# Patient Record
Sex: Female | Born: 1955 | Race: White | Hispanic: No | Smoking: Former smoker
Health system: Southern US, Community
[De-identification: ages and names within clinical notes are randomized; demographics above are authoritative.]

## PROBLEM LIST (undated history)

## (undated) DIAGNOSIS — N952 Postmenopausal atrophic vaginitis: Secondary | ICD-10-CM

## (undated) DIAGNOSIS — E039 Hypothyroidism, unspecified: Secondary | ICD-10-CM

## (undated) DIAGNOSIS — T7840XA Allergy, unspecified, initial encounter: Secondary | ICD-10-CM

## (undated) DIAGNOSIS — D259 Leiomyoma of uterus, unspecified: Secondary | ICD-10-CM

## (undated) DIAGNOSIS — Q438 Other specified congenital malformations of intestine: Secondary | ICD-10-CM

## (undated) DIAGNOSIS — D126 Benign neoplasm of colon, unspecified: Secondary | ICD-10-CM

## (undated) DIAGNOSIS — K6389 Other specified diseases of intestine: Secondary | ICD-10-CM

## (undated) DIAGNOSIS — N200 Calculus of kidney: Secondary | ICD-10-CM

## (undated) DIAGNOSIS — M8580 Other specified disorders of bone density and structure, unspecified site: Secondary | ICD-10-CM

## (undated) DIAGNOSIS — K219 Gastro-esophageal reflux disease without esophagitis: Secondary | ICD-10-CM

## (undated) DIAGNOSIS — K449 Diaphragmatic hernia without obstruction or gangrene: Secondary | ICD-10-CM

## (undated) DIAGNOSIS — K589 Irritable bowel syndrome without diarrhea: Secondary | ICD-10-CM

## (undated) HISTORY — DX: Hypothyroidism, unspecified: E03.9

## (undated) HISTORY — DX: Allergy, unspecified, initial encounter: T78.40XA

## (undated) HISTORY — DX: Benign neoplasm of colon, unspecified: D12.6

## (undated) HISTORY — DX: Irritable bowel syndrome, unspecified: K58.9

## (undated) HISTORY — DX: Leiomyoma of uterus, unspecified: D25.9

## (undated) HISTORY — DX: Other specified congenital malformations of intestine: Q43.8

## (undated) HISTORY — PX: DILATION AND CURETTAGE OF UTERUS: SHX78

## (undated) HISTORY — PX: COLONOSCOPY: SHX174

## (undated) HISTORY — PX: BUNIONECTOMY: SHX129

## (undated) HISTORY — DX: Diaphragmatic hernia without obstruction or gangrene: K44.9

## (undated) HISTORY — DX: Gastro-esophageal reflux disease without esophagitis: K21.9

## (undated) HISTORY — DX: Calculus of kidney: N20.0

## (undated) HISTORY — PX: UPPER GASTROINTESTINAL ENDOSCOPY: SHX188

## (undated) HISTORY — DX: Postmenopausal atrophic vaginitis: N95.2

## (undated) HISTORY — PX: POLYPECTOMY: SHX149

## (undated) HISTORY — DX: Other specified diseases of intestine: K63.89

## (undated) HISTORY — DX: Other specified disorders of bone density and structure, unspecified site: M85.80

## (undated) HISTORY — PX: FOOT SURGERY: SHX648

---

## 1997-11-23 ENCOUNTER — Other Ambulatory Visit: Admission: RE | Admit: 1997-11-23 | Discharge: 1997-11-23 | Payer: Self-pay | Admitting: Obstetrics and Gynecology

## 1999-03-16 ENCOUNTER — Ambulatory Visit (HOSPITAL_COMMUNITY): Admission: RE | Admit: 1999-03-16 | Discharge: 1999-03-16 | Payer: Self-pay | Admitting: Internal Medicine

## 2000-01-14 ENCOUNTER — Other Ambulatory Visit: Admission: RE | Admit: 2000-01-14 | Discharge: 2000-01-14 | Payer: Self-pay | Admitting: Obstetrics and Gynecology

## 2001-01-21 ENCOUNTER — Other Ambulatory Visit: Admission: RE | Admit: 2001-01-21 | Discharge: 2001-01-21 | Payer: Self-pay | Admitting: Obstetrics and Gynecology

## 2002-01-22 ENCOUNTER — Other Ambulatory Visit: Admission: RE | Admit: 2002-01-22 | Discharge: 2002-01-22 | Payer: Self-pay | Admitting: Obstetrics and Gynecology

## 2003-02-25 ENCOUNTER — Other Ambulatory Visit: Admission: RE | Admit: 2003-02-25 | Discharge: 2003-02-25 | Payer: Self-pay | Admitting: Obstetrics and Gynecology

## 2004-03-01 ENCOUNTER — Other Ambulatory Visit: Admission: RE | Admit: 2004-03-01 | Discharge: 2004-03-01 | Payer: Self-pay | Admitting: Obstetrics and Gynecology

## 2005-03-20 ENCOUNTER — Other Ambulatory Visit: Admission: RE | Admit: 2005-03-20 | Discharge: 2005-03-20 | Payer: Self-pay | Admitting: Obstetrics and Gynecology

## 2005-04-05 ENCOUNTER — Ambulatory Visit: Payer: Self-pay | Admitting: Internal Medicine

## 2005-05-07 ENCOUNTER — Ambulatory Visit: Payer: Self-pay | Admitting: Internal Medicine

## 2006-03-31 ENCOUNTER — Other Ambulatory Visit: Admission: RE | Admit: 2006-03-31 | Discharge: 2006-03-31 | Payer: Self-pay | Admitting: Obstetrics and Gynecology

## 2006-09-24 ENCOUNTER — Ambulatory Visit: Payer: Self-pay | Admitting: Internal Medicine

## 2006-11-05 ENCOUNTER — Ambulatory Visit: Payer: Self-pay | Admitting: Internal Medicine

## 2006-12-25 ENCOUNTER — Ambulatory Visit: Payer: Self-pay | Admitting: Internal Medicine

## 2007-04-20 ENCOUNTER — Other Ambulatory Visit: Admission: RE | Admit: 2007-04-20 | Discharge: 2007-04-20 | Payer: Self-pay | Admitting: Obstetrics and Gynecology

## 2007-06-02 DIAGNOSIS — T7840XA Allergy, unspecified, initial encounter: Secondary | ICD-10-CM | POA: Insufficient documentation

## 2007-06-02 DIAGNOSIS — N200 Calculus of kidney: Secondary | ICD-10-CM | POA: Insufficient documentation

## 2008-04-21 ENCOUNTER — Other Ambulatory Visit: Admission: RE | Admit: 2008-04-21 | Discharge: 2008-04-21 | Payer: Self-pay | Admitting: Obstetrics and Gynecology

## 2008-04-21 ENCOUNTER — Encounter: Payer: Self-pay | Admitting: Obstetrics and Gynecology

## 2008-04-21 ENCOUNTER — Ambulatory Visit: Payer: Self-pay | Admitting: Obstetrics and Gynecology

## 2008-06-28 ENCOUNTER — Ambulatory Visit: Payer: Self-pay | Admitting: Obstetrics and Gynecology

## 2008-07-11 ENCOUNTER — Encounter: Payer: Self-pay | Admitting: Obstetrics and Gynecology

## 2008-07-11 ENCOUNTER — Ambulatory Visit: Payer: Self-pay | Admitting: Obstetrics and Gynecology

## 2009-01-02 ENCOUNTER — Ambulatory Visit: Payer: Self-pay | Admitting: Obstetrics and Gynecology

## 2009-01-23 ENCOUNTER — Ambulatory Visit: Payer: Self-pay | Admitting: Obstetrics and Gynecology

## 2009-03-03 ENCOUNTER — Ambulatory Visit: Payer: Self-pay | Admitting: Obstetrics and Gynecology

## 2009-04-26 ENCOUNTER — Ambulatory Visit: Payer: Self-pay | Admitting: Obstetrics and Gynecology

## 2009-04-26 ENCOUNTER — Other Ambulatory Visit: Admission: RE | Admit: 2009-04-26 | Discharge: 2009-04-26 | Payer: Self-pay | Admitting: Obstetrics and Gynecology

## 2010-02-07 ENCOUNTER — Telehealth: Payer: Self-pay | Admitting: Internal Medicine

## 2010-02-12 ENCOUNTER — Telehealth (INDEPENDENT_AMBULATORY_CARE_PROVIDER_SITE_OTHER): Payer: Self-pay | Admitting: *Deleted

## 2010-04-24 NOTE — Progress Notes (Signed)
Summary: Make appt for pt   Phone Note Outgoing Call   Call placed by: Joselyn Glassman,  February 12, 2010 10:19 AM Call placed to: Patient Summary of Call: Called pt and LM for her to call me back today.  I advised that our PA Amy Monica Becton has several openings today, tom the 22nd and Wed the 23rd.  I let her know we are only in until 2Pm on Wed the 23rd.  I advised if she is still having the abd burning, reflux and bloating we would we glad to make her an appt to be seen.   Initial call taken by: Joselyn Glassman,  February 12, 2010 10:21 AM  Follow-up for Phone Call        LM for pt again on her home phone [phone] to call us about making an appt. Follow-up by: Joselyn Glassman,  February 13, 2010 1:07 PM

## 2010-04-24 NOTE — Progress Notes (Signed)
Summary: Triage   Phone Note Call from Patient Call back at Work Phone [phone]   Caller: Patient Call For: Dr. Juanda Chance Reason for Call: Talk to Nurse Summary of Call: having abd burning, acid reflux, bloating...requesting sooner appt or something prescribed Initial call taken by: Karna Christmas,  February 07, 2010 9:42 AM  Follow-up for Phone Call        Message left for patient to call back at work number. Jesse Fall RN  February 07, 2010 10:04 AM Patient calling to report esphageal burning and burning in stomach. She has tried OTC antacids without relief. She recently changed her Probiotic. States she recently remarried and is under stress. Patient scheduled to see Willette Cluster, RNP 02/08/10 at 9:30 AM. Follow-up by: Jesse Fall RN,  February 07, 2010 10:25 AM  Additional Follow-up for Phone Call Additional follow up Details #1::        reviewed and agree. Additional Follow-up by: Hart Carwin MD,  February 07, 2010 11:15 PM     Appended Document: Triage Pt cancelled appt, spoke to Sharee Holster today 02-08-10. Said she is off work next week. Cannot make appt today with Lafe Garin ACNP.  Told Revonda Standard she would like to be scheduled next week with extender.  I called and Lm for pt to call me @ 9:53 am.

## 2010-05-10 ENCOUNTER — Encounter: Payer: Self-pay | Admitting: Internal Medicine

## 2010-05-14 ENCOUNTER — Other Ambulatory Visit (HOSPITAL_COMMUNITY)
Admission: RE | Admit: 2010-05-14 | Discharge: 2010-05-14 | Disposition: A | Discharge status: Home / Self Care | Payer: Managed Care, Other (non HMO) | Source: Ambulatory Visit | Attending: Obstetrics and Gynecology | Admitting: Obstetrics and Gynecology

## 2010-05-14 DIAGNOSIS — Z124 Encounter for screening for malignant neoplasm of cervix: Secondary | ICD-10-CM | POA: Insufficient documentation

## 2010-05-15 ENCOUNTER — Encounter (INDEPENDENT_AMBULATORY_CARE_PROVIDER_SITE_OTHER): Payer: Managed Care, Other (non HMO) | Admitting: Obstetrics and Gynecology

## 2010-05-15 ENCOUNTER — Other Ambulatory Visit: Payer: Self-pay | Admitting: Obstetrics and Gynecology

## 2010-05-15 DIAGNOSIS — Z833 Family history of diabetes mellitus: Secondary | ICD-10-CM

## 2010-05-15 DIAGNOSIS — Z13220 Encounter for screening for lipoid disorders: Secondary | ICD-10-CM

## 2010-05-15 DIAGNOSIS — Z01419 Encounter for gynecological examination (general) (routine) without abnormal findings: Secondary | ICD-10-CM

## 2010-05-16 NOTE — Letter (Signed)
Summary: Colonoscopy Letter  [facility]  [address] [place], [place] [address]   Phone: [phone]  Fax: [phone]      May 10, 2010 MRN: [id number]   Kathleen Arroyo [address] [place], [place]  [address]   Dear Ms. Kathleen Arroyo,   According to your medical record, it is time for you to schedule a Colonoscopy. The American Cancer Society recommends this procedure as a method to detect early colon cancer. Patients with a family history of colon cancer, or a personal history of colon polyps or inflammatory bowel disease are at increased risk.  This letter has been generated based on the recommendations made at the time of your procedure. If you feel that in your particular situation this may no longer apply, please contact our office.  Please call our office at [phone] to schedule this appointment or to update your records at your earliest convenience.  Thank you for cooperating with [place] to provide you with the very best care possible.   Sincerely,  Hedwig Morton. Juanda Chance, M.D.  [facility] Gastroenterology Division [phone]

## 2010-06-04 ENCOUNTER — Encounter: Payer: Self-pay | Admitting: Gastroenterology

## 2010-06-12 NOTE — Letter (Signed)
Summary: Pre Visit Letter Revised  [facility]  [address] [place], [place] [address]   Phone: [phone]  Fax: [phone]        06/04/2010 MRN: [id number]  Kathleen Arroyo [address] [place], [place]  [address]             Procedure Date:  07-09-10 11am           Dr Christella Hartigan   Recall Colon  Welcome to the Gastroenterology Division at [facility].    You are scheduled to see a nurse for your pre-procedure visit on 06-25-10 at 8am on the 3rd floor at [facility], [address].  We ask that you try to arrive at our office 15 minutes prior to your appointment time to allow for check-in.  Please take a minute to review the attached form.  If you answer "Yes" to one or more of the questions on the first page, we ask that you call the person listed at your earliest opportunity.  If you answer "No" to all of the questions, please complete the rest of the form and bring it to your appointment.    Your nurse visit will consist of discussing your medical and surgical history, your immediate family medical history, and your medications.   If you are unable to list all of your medications on the form, please bring the medication bottles to your appointment and we will list them.  We will need to be aware of both prescribed and over the counter drugs.  We will need to know exact dosage information as well.    Please be prepared to read and sign documents such as consent forms, a financial agreement, and acknowledgement forms.  If necessary, and with your consent, a friend or relative is welcome to sit-in on the nurse visit with you.  Please bring your insurance card so that we may make a copy of it.  If your insurance requires a referral to see a specialist, please bring your referral form from your primary care physician.  No co-pay is required for this nurse visit.     If you cannot keep your appointment, please call [phone] to cancel or reschedule  prior to your appointment date.  This allows [place] the opportunity to schedule an appointment for another patient in need of care.    Thank you for choosing [facility] for your medical needs.  We appreciate the opportunity to care for you.  Please visit [place] at our website  to learn more about our practice.  Sincerely, The Gastroenterology Division

## 2010-06-25 ENCOUNTER — Ambulatory Visit (AMBULATORY_SURGERY_CENTER): Payer: Managed Care, Other (non HMO) | Admitting: *Deleted

## 2010-06-25 VITALS — Ht 66.0 in | Wt 126.0 lb

## 2010-06-25 DIAGNOSIS — Z800 Family history of malignant neoplasm of digestive organs: Secondary | ICD-10-CM

## 2010-06-25 MED ORDER — PEG-KCL-NACL-NASULF-NA ASC-C 100 G PO SOLR: 1.0000 | Freq: Once | ORAL | Status: DC

## 2010-07-09 ENCOUNTER — Ambulatory Visit (AMBULATORY_SURGERY_CENTER): Payer: Managed Care, Other (non HMO) | Admitting: Internal Medicine

## 2010-07-09 ENCOUNTER — Encounter: Payer: Self-pay | Admitting: Internal Medicine

## 2010-07-09 VITALS — BP 154/79 | HR 77 | Temp 98.1°F | Resp 15 | Ht 66.0 in | Wt 127.0 lb

## 2010-07-09 DIAGNOSIS — Z1211 Encounter for screening for malignant neoplasm of colon: Secondary | ICD-10-CM

## 2010-07-09 DIAGNOSIS — Z800 Family history of malignant neoplasm of digestive organs: Secondary | ICD-10-CM

## 2010-07-09 MED ORDER — SODIUM CHLORIDE 0.9 % IV SOLN: 500.0000 mL | INTRAVENOUS | Status: DC

## 2010-07-09 NOTE — Patient Instructions (Signed)
Discharge instructions given Your exam was normal. Because of your risk factors, you should have a repeat exam in 5 years. Please eat a diet that is high in fiber.

## 2010-07-10 ENCOUNTER — Telehealth: Payer: Self-pay | Admitting: *Deleted

## 2010-07-10 NOTE — Telephone Encounter (Signed)
Follow up Call- Patient questions:  Do you have a fever, pain , or abdominal swelling? yes Pain Score  2 *  Have you tolerated food without any problems? yes  Have you been able to return to your normal activities? no  Do you have any questions about your discharge instructions: Diet   no Medications  no Follow up visit  no  Do you have questions or concerns about your Care? no  Actions: * If pain score is 4 or above: No action needed, pain <4.  Patient states that she has a lot of gas.    Explained the things she could do for relief, and she will try them.  States just uncomfortable with a pain score of a #2. Patient told to call us if the pain gets worse.

## 2010-08-07 NOTE — Assessment & Plan Note (Signed)
[facility]                         GASTROENTEROLOGY OFFICE NOTE   NAME:JOHNSONDiva, Lemberger                  MRN:          [id number]  DATE:11/05/2006                            DOB:          [birth year]    Ms. Laural Benes is a 55 year old white female who has a history of irritable  bowel syndrome.  She has been experiencing fluttering in her chest and  also some bloating.  She had a cardiac workup, which was negative for  primary heart disease.  We have put her empirically on Nexium 40 mg a  day, which has really not made much difference in her symptomatology.  She has eliminated caffeine from her diet and her symptoms have been  reduced remarkably.  There have been still episodes of fluttering in her  chest, which last several seconds at a time, but she denies any  indigestion.  We have seen Adreena in the past for colorectal  screening.  Her colonoscopy in February 2007 was essentially normal.  She has a family history of colon cancer in her father.  Today, we have  discussed irritable bowel syndrome.  I have given her samples of Align  for bacterial overgrowth and also advised her to stay on high fiber  diet.  We will try to go off Nexium for a period of the next 4 weeks,  and compare her symptoms on and off Nexium.  If the symptoms come back,  that would be an indication for an upper endoscopy and further GI  investigation.  If the symptoms do not change, I do not see any point in  doing a further GI investigation.     Hedwig Morton. Juanda Chance, MD  Electronically Signed    DMB/MedQ  DD: 11/05/2006  DT: 11/06/2006  Job #: [id number]   cc:   Carlyn Reichert, MD

## 2010-08-07 NOTE — Assessment & Plan Note (Signed)
[facility]                         GASTROENTEROLOGY OFFICE NOTE   NAME:JOHNSONTaleeyah, Arroyo                  MRN:          [id number]  DATE:12/25/2006                            DOB:          October 23, 1955    Kathleen Arroyo is a very nice 55 year old white female whom we saw in the  past for colorectal screening.  She has a positive family history of  colon cancer in her father.  Her colonoscopy in December of 2000 and  again in February of 2007 was essentially normal.  On the last  colonoscopy I failed to visualize cecal pouch.  She is scheduled for  repeat colonoscopy in 5 years which would be February of 2012.  She has  had occasional left lower quadrant abdominal discomfort which is related  to her constipation.  Her bowel movements are once a week.  She has  tried aloe vera and probiotics which seem to be helping, and she wanted  my approval of it today.   PHYSICAL EXAMINATION:  Blood pressure 122/74, pulse 68 and weight 117  pounds.  She was slim, in no distress.  Abdominal exam showed flat, soft abdomen without tenderness.  No  palpable stool.  Normoactive bowel sounds.  No distension.  RECTAL EXAM:  Not done.   IMPRESSION:  A 55 year old white female with functional constipation.  Normal colonoscopy x2.   PLAN:  1. We have discussed high-fiber diet, adding fiber supplements on a      daily basis rather than just erratically.  2. Okay to take aloe vera juice as well as probiotic daily.  3. Try MiraLax on a p.r.n. basis, either over the counter, or we will      be happy to give her a prescription for it.  She has tried it and      would rather not use it at this time, only if she gets really      constipated.  4. Consider using glycerin suppositories for easier evacuation, or      even Fleet enema.  I would be happy to see her for the constipation      if she has persistent problems.     Kathleen Arroyo. Juanda Chance, MD  Electronically Signed    DMB/MedQ  DD: 12/25/2006  DT: 12/25/2006  Job #: [id number]   cc:   Harl Bowie, MD

## 2010-08-07 NOTE — Assessment & Plan Note (Signed)
[facility]                         GASTROENTEROLOGY OFFICE NOTE   NAME:JOHNSONBostyn, Kunkler                  MRN:          [id number]  DATE:09/24/2006                            DOB:          03-13-1956    GI CONSULTATION   [place] is a 55 year old white female referred by Dr. Sharee Pimple for  evaluation of chest pain.  We saw Ms. Johnson in 2000 and in February of  2007 for colorectal screening because of a family history of colon  cancer in her father.  Her exam was normal.  In the last month, she has  experienced an episode of chest pain or flutter, which lasted several  seconds and caused her to be light-headed, almost pre-syncopal.  This  has happened in the past on several previous occasions, but never with  the associated dizziness and weakness.  She denies any history of  gastroesophageal reflux.  She denies dysphagia, odynophagia.  She has  had some food intolerance and dyspepsia on occasion.  She denies taking  NSAID.  She has taken Zantac on p.r.n. basis, and since she saw Dr.  Sharee Pimple has taken Nexium 40 mg daily.  Total parenteral nutrition, which  she experienced, was more of a flutter.  It did not radiate up to her  neck or to her infrascapular area.  She underwent echocardiogram, which  apparently was normal.  The episode occurred in the morning.  She had 2  cups of coffee, which she usually has in the morning.   MEDICATIONS:  1. Nexium 40 mg p.o. daily.  2. Stool softener.  3. Birth control pills.   PAST HISTORY:  Significant for kidney stones 20 years ago, and  allergies.   FAMILY HISTORY:  Positive for colon cancer and diabetes in her father.   SOCIAL HISTORY:  Divorced.  She works as a Librarian, academic.  She has 2  years of college.  She does not smoke and drinks alcohol only socially.  She drinks 2 cups of coffee a day.  She has been under a great deal of  stress recently.   REVIEW OF SYSTEMS:  Positive for allergies, frequent  cough, dizziness,  fatigue, itching.   PHYSICAL EXAM:  Blood pressure 126/72, pulse 80, and weight 116 pounds.  She was thin, well-sun-tanned, healthy-appearing.  Oral cavity was normal.  NECK:  Supple.  Thyroid not enlarged.  LUNGS:  Clear to auscultation.  No wheezes or rales.  COR:  Quiet precordium.  Normal S1, S2.  There was no click.  No murmur.  ABDOMEN:  Unremarkable.  Scaphoid abdomen.  Normal muscular support.  Normal bowel sounds.  Liver edge at costal margin.   LABORATORY DATA:  From Dr. Laneta Simmers office shows normal hemoglobin of  14.2.  Normal liver function tests, and TSH.   IMPRESSION:  A 55 year old white female with an episode of chest  pain/flutter associated with dizziness and pre-syncopal episode.  Normal  echocardiogram and heart exam.  Episode occurred after drinking coffee,  and it is not clear whether this was a run of premature atrial  contractions or whether this was an episode of gastroesophageal reflux  with esophageal spasm.  She seemed to have had episodes like that  sporadically.  I am not sure that she really needs upper endoscopy at  this point, since the normal endoscopy would not rule out the presence  of gastroesophageal reflux.  Since she does not have any dysphagia or  odynophagia, it would be unlikely for her to have a lesion of the  esophageus.  I am more concerned about the possibility of her having  premature atrial tachycardia induced by caffeine or stress.  I have  discussed this with the patient extensively.  She was not sure she  wanted to have an upper endoscopy.  We cannot pinpoint it 100%.   PLAN:  1. We have decided to delay endoscopy at this time.  The patient will      take Prilosec 20 mg a day after finishing her samples of Nexium.  I      gave her a prescription for 1 month.  2. Eliminate caffeine in the morning.  If the episodes recur, I would      probably prefer to contact Dr. Sharee Pimple for possibility of Holter      monitor or  some event monitor to rule out cardiac arrhythmia.  In      the meantime, if she develops any specific upper GI symptoms of      reflux, regurgitation, or clearly a digestive symptom, we will      proceed with upper endoscopy.  I would like to see her in about 2      months.     Hedwig Morton. Juanda Chance, MD  Electronically Signed    DMB/MedQ  DD: 09/24/2006  DT: 09/24/2006  Job #: [id number]   cc:   Harl Bowie, M.D.

## 2010-09-06 ENCOUNTER — Ambulatory Visit (INDEPENDENT_AMBULATORY_CARE_PROVIDER_SITE_OTHER): Payer: Managed Care, Other (non HMO) | Admitting: Internal Medicine

## 2010-09-06 ENCOUNTER — Encounter: Payer: Self-pay | Admitting: Internal Medicine

## 2010-09-06 VITALS — BP 118/72 | HR 80 | Wt 126.0 lb

## 2010-09-06 DIAGNOSIS — K5901 Slow transit constipation: Secondary | ICD-10-CM

## 2010-09-06 DIAGNOSIS — R1013 Epigastric pain: Secondary | ICD-10-CM

## 2010-09-06 DIAGNOSIS — K3189 Other diseases of stomach and duodenum: Secondary | ICD-10-CM

## 2010-09-06 MED ORDER — LUBIPROSTONE 24 MCG PO CAPS: ORAL_CAPSULE | ORAL | Status: DC

## 2010-09-06 MED ORDER — FAMOTIDINE 40 MG PO TABS: 40.0000 mg | ORAL_TABLET | Freq: Every evening | ORAL | Status: DC

## 2010-09-06 NOTE — Progress Notes (Signed)
Kathleen Arroyo Sep 12, 1955 MRN [id number]      History of Present Illness:  This is a 55 year old white female with chronic constipation and abdominal pain following meals. She had a normal colonoscopy in April 2012 for neoplastic screening. Her father had colon cancer and her prior colonoscopies were in 2001 in 2007. She has been under a great deal of stress and has developed abdominal distention, bloating and abdominal pain. She remains physically active and tries to eat small amounts but has gained some weight. She denies rectal bleeding.   Past Medical History  Diagnosis Date  . Renal calculus   . IBS (irritable bowel syndrome)    Past Surgical History  Procedure Date  . Colonoscopy   . Upper gastrointestinal endoscopy   . Cesarean section     x 1  . Bunionectomy     Right foot bunionectomy and hammer toes bilaterallly  . Dilation and curettage of uterus     reports that she has been smoking.  She has never used smokeless tobacco. She reports that she drinks about 1.2 ounces of alcohol per week. She reports that she does not use illicit drugs. family history includes Breast cancer in her mother; Colon cancer in her father; Colon polyps in her father; Diabetes in her father; and Heart disease in her father and mother. No Known Allergies      Review of Systems:  The remainder of the 10  point ROS is negative except as outlined in H&P   Physical Exam: General appearance  Well developed, in no distress. Eyes- non icteric. HEENT nontraumatic, normocephalic. Mouth no lesions, tongue papillated, no cheilosis. Neck supple without adenopathy, thyroid not enlarged, no carotid bruits, no JVD. Lungs Clear to auscultation bilaterally. Cor normal S1 normal S2, regular rhythm , no murmur,  quiet precordium. Abdomen mildly distended abdomen with normal active bowel sounds. Very tender mostly in the left lower quadrant. No palpable mass. Rectal: Not done. Extremities no pedal  edema. Skin no lesions. Neurological alert and oriented x 3. Psychological normal mood and affect.  Assessment and Plan:  Problem #1 Irritable bowel syndrome with predominant constipation. Stress has been a complicating factor. We have discussed extensively the use of laxatives, probiotics as well as promotility agents. She will start on Amitiza  24 mcg daily and if not effective may switch to MiraLax 17 g every day and titrate the dose to having 1-2 bowel movements a day. We will also start her on Pepcid 40 mg daily. We talked about the possibility of Paxil or other antidepressants to help her stress.  Problem #2 Family history of colon cancer in her father. She is up-to-date on her colonoscopy.   09/06/2010 [name]

## 2010-09-06 NOTE — Patient Instructions (Addendum)
We have sent a prescription to your pharmacy for Amitiza 24 mcg. You should take 1 tablet by mouth once daily. We have sent a prescription to your pharmacy for Pepcid 40 mg. You should take 1 tablet by mouth once daily. CC: Dr Harl Bowie

## 2010-11-16 ENCOUNTER — Encounter: Payer: Self-pay | Admitting: Internal Medicine

## 2010-11-16 ENCOUNTER — Ambulatory Visit (INDEPENDENT_AMBULATORY_CARE_PROVIDER_SITE_OTHER): Payer: Managed Care, Other (non HMO) | Admitting: Internal Medicine

## 2010-11-16 ENCOUNTER — Other Ambulatory Visit (INDEPENDENT_AMBULATORY_CARE_PROVIDER_SITE_OTHER): Payer: Managed Care, Other (non HMO)

## 2010-11-16 DIAGNOSIS — R1013 Epigastric pain: Secondary | ICD-10-CM

## 2010-11-16 DIAGNOSIS — K5901 Slow transit constipation: Secondary | ICD-10-CM

## 2010-11-16 DIAGNOSIS — K3189 Other diseases of stomach and duodenum: Secondary | ICD-10-CM

## 2010-11-16 DIAGNOSIS — K589 Irritable bowel syndrome without diarrhea: Secondary | ICD-10-CM

## 2010-11-16 LAB — CBC WITH DIFFERENTIAL/PLATELET
Basophils Relative: 0.4 % (ref 0.0–3.0)
Eosinophils Relative: 5.6 % — ABNORMAL HIGH (ref 0.0–5.0)
Monocytes Relative: 11.8 % (ref 3.0–12.0)
Neutrophils Relative %: 45.9 % (ref 43.0–77.0)
Platelets: 193 10*3/uL (ref 150.0–400.0)
RBC: 4.29 Mil/uL (ref 3.87–5.11)
WBC: 4.2 10*3/uL — ABNORMAL LOW (ref 4.5–10.5)

## 2010-11-16 LAB — TSH: TSH: 3.8 u[IU]/mL (ref 0.35–5.50)

## 2010-11-16 LAB — COMPREHENSIVE METABOLIC PANEL
Albumin: 4.2 g/dL (ref 3.5–5.2)
CO2: 29 mEq/L (ref 19–32)
Calcium: 9.3 mg/dL (ref 8.4–10.5)
Chloride: 108 mEq/L (ref 96–112)
GFR: 82.29 mL/min (ref >60.00)
Glucose, Bld: 95 mg/dL (ref 70–99)
Potassium: 4.5 mEq/L (ref 3.5–5.1)
Sodium: 144 mEq/L (ref 135–145)
Total Protein: 7 g/dL (ref 6.0–8.3)

## 2010-11-16 LAB — SEDIMENTATION RATE: Sed Rate: 7 mm/hr (ref 0–22)

## 2010-11-16 MED ORDER — LUBIPROSTONE 8 MCG PO CAPS: 8.0000 ug | ORAL_CAPSULE | Freq: Two times a day (BID) | ORAL | Status: AC

## 2010-11-16 MED ORDER — OMEPRAZOLE 20 MG PO CPDR: 20.0000 mg | DELAYED_RELEASE_CAPSULE | Freq: Every day | ORAL | Status: DC

## 2010-11-16 NOTE — Progress Notes (Signed)
Kathleen Arroyo 16-Aug-1955 MRN [id number]     History of Present Illness:  This is a 55 year old white female with known functional constipation who's last office visit was 09/06/2010. She was started on Amitiza 24 mcg daily and MiraLax as well as Pepcid 40 mg a day. The constipation has improved but she still complains of dyspepsia, upper abdominal pain, bloating and just not feeling well. She has also had nausea. A colonoscopy in April 2012 was normal. She has a family history of colon cancer in her father. She is a vegetarian. She avoids milk and milk products.   Past Medical History  Diagnosis Date  . Renal calculus   . IBS (irritable bowel syndrome)    Past Surgical History  Procedure Date  . Colonoscopy   . Upper gastrointestinal endoscopy   . Cesarean section     x 1  . Bunionectomy     Right foot bunionectomy and hammer toes bilaterallly  . Dilation and curettage of uterus     reports that she has been smoking.  She has never used smokeless tobacco. She reports that she drinks about 1.2 ounces of alcohol per week. She reports that she does not use illicit drugs. family history includes Breast cancer in her mother; Colon cancer in her father; Colon polyps in her father; Diabetes in her father; and Heart disease in her father and mother. No Known Allergies      Review of Systems: Positive for heartburn. Denies odynophagia or dysphagia. Upper abdominal discomfort. No shortness of breath or chest pain  The remainder of the 10  point ROS is negative except as outlined in H&P   Physical Exam: General appearance  Well developed, in no distress. Eyes- non icteric. HEENT nontraumatic, normocephalic. Mouth no lesions, tongue papillated, no cheilosis. Neck supple without adenopathy, thyroid not enlarged, no carotid bruits, no JVD. Lungs Clear to auscultation bilaterally. Cor normal S1 normal S2, regular rhythm , no murmur,  quiet precordium. Abdomen increased bowel  sounds. Increased tympany. Mild distention. No specific tenderness. Liver edge at costal margin. Rectal: Not repeated. Extremities no pedal edema. Skin no lesions. Neurological alert and oriented x 3. Psychological normal mood and affect.  Assessment and Plan:  Problem #1 constipation has improved. We will reduce Amitiza to 8 mcg daily, to avoid urgent bowel movements.  Problem #2 dyspepsia. We will evaluate her with an upper abdominal ultrasound, upper endoscopy, CLO test and small bowel biopsies. We will also check her for H. Pylori. We will switch her from Pepcid to Prilosec 20 mg daily. We will also obtain tissue transglutaminase and sprue profile., metabolic panel, CBC, TSH and sedimentation rate. She is interested in a comprehensive evaluation.  Problem #3 family history of colon cancer. She is up-to-date on her colonoscopy. Her next exam will be due in April 2017.    11/16/2010 [name]

## 2010-11-16 NOTE — Patient Instructions (Addendum)
You have been scheduled for an endoscopy. Please follow written instructions given to you at your visit today. You have been scheduled for an abdominal ultrasound at [facility] (1st floor of hospital) on 11/21/10 at 8:30 am. Please arrive 15 minutes prior to your appointment for registration. Make certain not to have anything to eat or drink 6 hours prior to your appointment. Should you need to reschedule your appointment, please contact radiology at [phone]. Your physician has requested that you go to the basement for the following lab work before leaving today: Sedimentation Rate, CBC, CMET, Sprue Panel, TSH, IgA We have sent the following medications to your pharmacy for you to pick up at your convenience: We have changed your Pepcid to Prilosec 20 mg daily. We have changed your Amitiza to 8 mcg twice daily. CC: Dr Morton Stall

## 2010-11-19 ENCOUNTER — Telehealth: Payer: Self-pay | Admitting: *Deleted

## 2010-11-19 DIAGNOSIS — R1013 Epigastric pain: Secondary | ICD-10-CM

## 2010-11-19 LAB — CELIAC PANEL 10
Gliadin IgA: 6.2 U/mL (ref <20)
IgA: 222 mg/dL (ref 69–380)

## 2010-11-19 NOTE — Telephone Encounter (Signed)
Labs in [facility] for 12/13/10(EGD date)Patient notified of results and recommendations

## 2010-11-19 NOTE — Telephone Encounter (Signed)
Message copied by Daphine Deutscher on Mon Nov 19, 2010 11:49 AM ------      Message from: Hart Carwin      Created: Sat Nov 17, 2010 11:11 PM       Please call pt with normal results, will need to check B12 ans Folate level when she comes for EGD

## 2010-11-20 ENCOUNTER — Telehealth: Payer: Self-pay | Admitting: *Deleted

## 2010-11-20 NOTE — Telephone Encounter (Signed)
Left a message for patient to call me. 

## 2010-11-20 NOTE — Telephone Encounter (Signed)
Patient given results as per Dr. Brodie 

## 2010-11-20 NOTE — Telephone Encounter (Signed)
Message copied by Daphine Deutscher on Tue Nov 20, 2010  1:09 PM ------      Message from: Hart Carwin      Created: Tue Nov 20, 2010  9:36 AM       Please call pt with negative sprue profile

## 2010-11-21 ENCOUNTER — Ambulatory Visit (HOSPITAL_COMMUNITY)
Admission: RE | Admit: 2010-11-21 | Discharge: 2010-11-21 | Disposition: A | Discharge status: Home / Self Care | Payer: Managed Care, Other (non HMO) | Source: Ambulatory Visit | Attending: Internal Medicine | Admitting: Internal Medicine

## 2010-11-21 DIAGNOSIS — R1013 Epigastric pain: Secondary | ICD-10-CM

## 2010-11-21 DIAGNOSIS — R110 Nausea: Secondary | ICD-10-CM | POA: Insufficient documentation

## 2010-11-21 DIAGNOSIS — K7689 Other specified diseases of liver: Secondary | ICD-10-CM | POA: Insufficient documentation

## 2010-11-21 DIAGNOSIS — R109 Unspecified abdominal pain: Secondary | ICD-10-CM | POA: Insufficient documentation

## 2010-11-21 DIAGNOSIS — R1909 Other intra-abdominal and pelvic swelling, mass and lump: Secondary | ICD-10-CM | POA: Insufficient documentation

## 2010-11-22 ENCOUNTER — Telehealth: Payer: Self-pay | Admitting: *Deleted

## 2010-11-22 NOTE — Telephone Encounter (Signed)
Message copied by Daphine Deutscher on Thu Nov 22, 2010  2:49 PM ------      Message from: Hart Carwin      Created: Wed Nov 21, 2010  7:05 PM       Please call pt with results, ultrasound shows a cystic lesion between kidney and the liver. Please schedule CT scan of the abdomen with oral and IV contrasr

## 2010-11-22 NOTE — Telephone Encounter (Signed)
Patient given results. Scheduled CT of abd, pelvis at [place] on 11/23/10 at 2:30 PM. NPO 4 hours prior and drink contrast 2 and 1 hours prior. Contrast and instruction up front for pick up in AM. Patient aware.

## 2010-11-23 ENCOUNTER — Ambulatory Visit (INDEPENDENT_AMBULATORY_CARE_PROVIDER_SITE_OTHER)
Admission: RE | Admit: 2010-11-23 | Discharge: 2010-11-23 | Disposition: A | Discharge status: Home / Self Care | Payer: Managed Care, Other (non HMO) | Source: Ambulatory Visit | Attending: Internal Medicine | Admitting: Internal Medicine

## 2010-11-23 DIAGNOSIS — R109 Unspecified abdominal pain: Secondary | ICD-10-CM

## 2010-11-23 MED ORDER — IOHEXOL 300 MG/ML  SOLN
100.0000 mL | Freq: Once | INTRAMUSCULAR | Status: AC | PRN
  Administered 2010-11-23: 100 mL via INTRAVENOUS

## 2010-11-27 ENCOUNTER — Telehealth: Payer: Self-pay | Admitting: *Deleted

## 2010-11-27 NOTE — Telephone Encounter (Signed)
Left a message for patient to call me. 

## 2010-11-27 NOTE — Telephone Encounter (Signed)
Patient notified of results as per Dr. Brodie. 

## 2010-11-27 NOTE — Telephone Encounter (Signed)
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 There is also an incidental finding of a uterine fibroid 2.5 cm. 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 No acute process identified

## 2010-12-13 ENCOUNTER — Ambulatory Visit (AMBULATORY_SURGERY_CENTER): Payer: Managed Care, Other (non HMO) | Admitting: Internal Medicine

## 2010-12-13 ENCOUNTER — Encounter: Payer: Self-pay | Admitting: Internal Medicine

## 2010-12-13 DIAGNOSIS — K589 Irritable bowel syndrome without diarrhea: Secondary | ICD-10-CM

## 2010-12-13 DIAGNOSIS — K2940 Chronic atrophic gastritis without bleeding: Secondary | ICD-10-CM

## 2010-12-13 DIAGNOSIS — K5901 Slow transit constipation: Secondary | ICD-10-CM

## 2010-12-13 DIAGNOSIS — K3189 Other diseases of stomach and duodenum: Secondary | ICD-10-CM

## 2010-12-13 MED ORDER — SODIUM CHLORIDE 0.9 % IV SOLN: 500.0000 mL | INTRAVENOUS | Status: DC

## 2010-12-13 MED ORDER — OMEPRAZOLE 20 MG PO CPDR: 20.0000 mg | DELAYED_RELEASE_CAPSULE | Freq: Two times a day (BID) | ORAL | Status: DC

## 2010-12-13 NOTE — Patient Instructions (Signed)
Please read the handout given to you by your recovery room nurse.   Continue your prilosec once daily as directed by Dr. Juanda Chance.   You may resume your routine medications today.  Your biopsy results will be mailed to you within two weeks.    If you have any questions, please call us at [phone].   Thank-you for choosing [place] for your medical needs today.

## 2010-12-14 ENCOUNTER — Telehealth: Payer: Self-pay | Admitting: *Deleted

## 2010-12-14 ENCOUNTER — Telehealth: Payer: Self-pay | Admitting: Internal Medicine

## 2010-12-14 NOTE — Telephone Encounter (Signed)
Left message for patient to call number and ask for me on the 4th floor.

## 2010-12-14 NOTE — Telephone Encounter (Signed)
Message left to call if necessary. 

## 2010-12-14 NOTE — Telephone Encounter (Signed)
I spoke with the patient,and she is having the pain that she had before she came in to see [place] plus abd pain.   She states that she is having a lot of bloating, and was wondering what could be causing it.    I told her that the tests were still pending regarding the H-pylori and Celiac.  She stated nervousness and high anxiety at work.   She also stated that the conditions at work were probably not helping her condition.    I suggested that she take beano OTC for gas relief.   She is taking her prilosec twice daily as ordered by Dr. Juanda Chance.   Will forward this note to Dr. Juanda Chance to see if we can do anything else for the patient.   The patient stated that she would call back if the symptoms got worse.  She denied having to go to the ED at this time.

## 2010-12-19 ENCOUNTER — Encounter: Payer: Self-pay | Admitting: Internal Medicine

## 2010-12-20 ENCOUNTER — Telehealth: Payer: Self-pay | Admitting: Internal Medicine

## 2010-12-20 MED ORDER — SUCRALFATE 1 GM/10ML PO SUSP: ORAL | Status: DC

## 2010-12-20 MED ORDER — ESOMEPRAZOLE MAGNESIUM 40 MG PO CPDR: 40.0000 mg | DELAYED_RELEASE_CAPSULE | Freq: Every day | ORAL | Status: DC

## 2010-12-20 NOTE — Telephone Encounter (Signed)
Patient calling to report a burning pain in her throat and stomach ache. She states her whole stomach aches. She noticed this after eating Tuna last night. States she is having nausea but not vomiting. She feels like the "burning feeling" has been occurring more frequently again and that the Prilosec is not working like it did at first. She is taking Prilosec BID. Please, advise.

## 2010-12-20 NOTE — Telephone Encounter (Signed)
Rx sent to pharmacy. Patient notified of Dr. Regino Schultze recommendations.

## 2010-12-20 NOTE — Telephone Encounter (Signed)
Please  Start Carafate slurry 10cc po qid x 3 days then bid, #12 2 oz.  1 refill, also switch to Nexiem 40 mg, #30 1 po qd, 1 refill

## 2010-12-28 ENCOUNTER — Telehealth: Payer: Self-pay | Admitting: Internal Medicine

## 2010-12-28 NOTE — Telephone Encounter (Signed)
Spoke with patient and discussed what gastritis. She is wondering how long she take Nexium and Carafate. Please, advise.

## 2010-12-30 NOTE — Telephone Encounter (Signed)
I would like her to take the PPI and Carafate for 4 weeks and if her digestive problems continue, I would like to see her back and discuss other options/medication

## 2010-12-31 NOTE — Telephone Encounter (Signed)
Pt aware and will check in with the office at the end of the  4 weeks.

## 2011-01-10 NOTE — Telephone Encounter (Signed)
SEE OTHER NOTE

## 2011-03-05 ENCOUNTER — Other Ambulatory Visit: Payer: Self-pay

## 2011-03-05 MED ORDER — ESTRADIOL 0.1 MG/GM VA CREA: 42.5000 g | TOPICAL_CREAM | VAGINAL | Status: DC

## 2011-05-07 ENCOUNTER — Encounter: Payer: Self-pay | Admitting: *Deleted

## 2011-05-15 ENCOUNTER — Ambulatory Visit (INDEPENDENT_AMBULATORY_CARE_PROVIDER_SITE_OTHER): Payer: Managed Care, Other (non HMO) | Admitting: Internal Medicine

## 2011-05-15 ENCOUNTER — Encounter: Payer: Self-pay | Admitting: Internal Medicine

## 2011-05-15 VITALS — BP 110/70 | HR 70 | Ht 66.25 in | Wt 127.0 lb

## 2011-05-15 DIAGNOSIS — K589 Irritable bowel syndrome without diarrhea: Secondary | ICD-10-CM

## 2011-05-15 DIAGNOSIS — K219 Gastro-esophageal reflux disease without esophagitis: Secondary | ICD-10-CM

## 2011-05-15 MED ORDER — ALIGN 4 MG PO CAPS: 1.0000 | ORAL_CAPSULE | Freq: Every day | ORAL | Status: DC

## 2011-05-15 NOTE — Progress Notes (Signed)
Kathleen Arroyo Jun 24, 1955 MRN [id number]  History of Present Illness:  This is a 56 year old white female who became severely constipated after she took a 10 day course of antibiotics for a urinary tract infection during Christmas 2012. She became severely impacted and had to take several enemas. She had lower abdominal pain and is still experiencing diffuse abdominal tenderness. She has been on prune juice 4 ounces daily and Amitiza. We have seen her for functional constipation in the past. A colonoscopy in April 2012 was normal. She has a family history of colon cancer in her father. A CT scan of the abdomen in August 2012 showed uterine fibroids and a renal cyst. An upper endoscopy in September 2012 showed a small hiatal hernia. She has occasional gastroesophageal reflux for which she takes [place]. Her sprue profile and TSH were normal. Her MCV was high at 100. She does not drink excessive alcohol and does not smoke.   Past Medical History  Diagnosis Date  . Renal calculus   . IBS (irritable bowel syndrome)   . GERD (gastroesophageal reflux disease)   . Hiatal hernia   . Uterine fibroid    Past Surgical History  Procedure Date  . Colonoscopy   . Upper gastrointestinal endoscopy   . Cesarean section     x 1  . Bunionectomy     Right foot bunionectomy and hammer toes bilaterallly  . Dilation and curettage of uterus   . Foot sugery     reports that she has quit smoking. She has never used smokeless tobacco. She reports that she does not drink alcohol or use illicit drugs. family history includes Breast cancer in her mother; Colon cancer in her father; Colon polyps in her father; Diabetes in her father; and Heart disease in her father and mother. No Known Allergies      Review of Systems: No chest pain dysphagia and odynophagia  The remainder of the 10 point ROS is negative except as outlined in H&P   Physical Exam: General appearance  Well developed, in no distress. Eyes-  non icteric. HEENT nontraumatic, normocephalic. Mouth no lesions, tongue papillated, no cheilosis. Neck supple without adenopathy, thyroid not enlarged, no carotid bruits, no JVD. Lungs Clear to auscultation bilaterally. Cor normal S1, normal S2, regular rhythm, no murmur,  quiet precordium. Abdomen: Soft, relaxed abdomen with normal active bowel sounds. Tenderness of the left lower quadrant and right lower quadrant. No palpable mass. Rectal: Soft Hemoccult negative stool Extremities no pedal edema. Skin no lesions. Neurological alert and oriented x 3. Psychological normal mood and affect.  Assessment and Plan:  Problem #1 Irritable bowel syndrome with predominant constipation. She has had an exacerbation of constipation following a course of antibiotics due to change in her bacteria flora. I have given her samples of a probiotic to take daily. She is to continue stool softeners, high fiber diet and prune juice as needed.   Problem #2 Family history of colon cancer. A recall colonoscopy will be due in April 2017.  Problem #3 Gastroesophageal reflux. She will take over-the-counter ranitidine.   05/15/2011 Kathleen Arroyo

## 2011-05-15 NOTE — Patient Instructions (Signed)
We have given you samples of Align. This puts good bacteria back into your intestines. You should take 1 capsule by mouth once daily. If this works well for you, it can be purchased over the counter. CC: Dr. Gregary Signs

## 2011-05-21 DIAGNOSIS — D259 Leiomyoma of uterus, unspecified: Secondary | ICD-10-CM | POA: Insufficient documentation

## 2011-05-21 DIAGNOSIS — N952 Postmenopausal atrophic vaginitis: Secondary | ICD-10-CM | POA: Insufficient documentation

## 2011-05-21 DIAGNOSIS — K649 Unspecified hemorrhoids: Secondary | ICD-10-CM | POA: Insufficient documentation

## 2011-05-30 ENCOUNTER — Encounter: Payer: Self-pay | Admitting: Obstetrics and Gynecology

## 2011-05-30 ENCOUNTER — Other Ambulatory Visit (HOSPITAL_COMMUNITY)
Admission: RE | Admit: 2011-05-30 | Discharge: 2011-05-30 | Disposition: A | Discharge status: Home / Self Care | Payer: Managed Care, Other (non HMO) | Source: Ambulatory Visit | Attending: Obstetrics and Gynecology | Admitting: Obstetrics and Gynecology

## 2011-05-30 ENCOUNTER — Ambulatory Visit (INDEPENDENT_AMBULATORY_CARE_PROVIDER_SITE_OTHER): Payer: Managed Care, Other (non HMO) | Admitting: Obstetrics and Gynecology

## 2011-05-30 VITALS — BP 120/76 | Ht 66.0 in | Wt 126.0 lb

## 2011-05-30 DIAGNOSIS — M8580 Other specified disorders of bone density and structure, unspecified site: Secondary | ICD-10-CM | POA: Insufficient documentation

## 2011-05-30 DIAGNOSIS — Z01419 Encounter for gynecological examination (general) (routine) without abnormal findings: Secondary | ICD-10-CM | POA: Insufficient documentation

## 2011-05-30 DIAGNOSIS — N949 Unspecified condition associated with female genital organs and menstrual cycle: Secondary | ICD-10-CM

## 2011-05-30 DIAGNOSIS — R102 Pelvic and perineal pain: Secondary | ICD-10-CM

## 2011-05-30 DIAGNOSIS — M899 Disorder of bone, unspecified: Secondary | ICD-10-CM

## 2011-05-30 DIAGNOSIS — Z833 Family history of diabetes mellitus: Secondary | ICD-10-CM

## 2011-05-30 LAB — HEMOGLOBIN A1C
Hgb A1c MFr Bld: 5.5 % (ref <5.7)
Mean Plasma Glucose: 111 mg/dL (ref <117)

## 2011-05-30 LAB — CBC WITH DIFFERENTIAL/PLATELET
Basophils Relative: 0 % (ref 0–1)
Eosinophils Absolute: 0.1 10*3/uL (ref 0.0–0.7)
Hemoglobin: 14.6 g/dL (ref 12.0–15.0)
MCH: 33 pg (ref 26.0–34.0)
MCHC: 34.4 g/dL (ref 30.0–36.0)
Monocytes Relative: 11 % (ref 3–12)
Neutro Abs: 1.5 10*3/uL — ABNORMAL LOW (ref 1.7–7.7)
Neutrophils Relative %: 40 % — ABNORMAL LOW (ref 43–77)
Platelets: 196 10*3/uL (ref 150–400)
RBC: 4.42 MIL/uL (ref 3.87–5.11)

## 2011-05-30 NOTE — Patient Instructions (Signed)
Schedule pelvic ultrasound and  bone density.

## 2011-05-30 NOTE — Progress Notes (Signed)
Patient came to see me today for her annual GYN exam. She is postmenopausal not on HRT. She does use estrogen cream for vaginal dryness. She is using it less frequently because of the expense. She is having no vaginal bleeding. She has had some pelvic pain over the past month in the midline. She has a history of ovarian cysts. It feels similar to when she had an ovarian cyst. She has noticed a diminished libido. She never tried the testosterone cream I gave her. She had her mammogram today. Her last bone density was 2010 and she did have osteopenia without an elevated FRAX risk. She takes calcium and vitamin D. She's had no fractures.  Physical examination: Kennon Portela present HEENT within normal limits. Neck: Thyroid not large. No masses. Supraclavicular nodes: not enlarged. Breasts: Examined in both sitting midline position. No skin changes and no masses. Abdomen: Soft no guarding rebound or masses or hernia. Pelvic: External: Within normal limits. BUS: Within normal limits. Vaginal:within normal limits. Poor estrogen effect. No evidence of cystocele rectocele or enterocele. Cervix: clean. Uterus: Normal size and shape. Adnexa: No masses. Rectovaginal exam: Confirmatory and negative. Extremities: Within normal limits.  Assessment: #1. Pelvic pain #2. Atrophic vaginitis #3. Diminished libido #4. Osteopenia  Plan: Patient to start testosterone cream 2%. Patient will find out preferred estrogen cream and call us for prescription. Discussed custom care as one option. Return for both bone density and pelvic ultrasound.

## 2011-05-31 ENCOUNTER — Encounter: Payer: Self-pay | Admitting: Obstetrics and Gynecology

## 2011-05-31 LAB — URINALYSIS W MICROSCOPIC + REFLEX CULTURE
Casts: NONE SEEN
Crystals: NONE SEEN
Glucose, UA: NEGATIVE mg/dL
Leukocytes, UA: NEGATIVE
Nitrite: NEGATIVE
Specific Gravity, Urine: 1.009 (ref 1.005–1.030)
Squamous Epithelial / LPF: NONE SEEN
pH: 7 (ref 5.0–8.0)

## 2011-06-12 ENCOUNTER — Ambulatory Visit (INDEPENDENT_AMBULATORY_CARE_PROVIDER_SITE_OTHER): Payer: Managed Care, Other (non HMO)

## 2011-06-12 ENCOUNTER — Ambulatory Visit (INDEPENDENT_AMBULATORY_CARE_PROVIDER_SITE_OTHER): Payer: Managed Care, Other (non HMO) | Admitting: Obstetrics and Gynecology

## 2011-06-12 DIAGNOSIS — D259 Leiomyoma of uterus, unspecified: Secondary | ICD-10-CM

## 2011-06-12 DIAGNOSIS — R1032 Left lower quadrant pain: Secondary | ICD-10-CM

## 2011-06-12 DIAGNOSIS — D219 Benign neoplasm of connective and other soft tissue, unspecified: Secondary | ICD-10-CM

## 2011-06-12 DIAGNOSIS — N854 Malposition of uterus: Secondary | ICD-10-CM

## 2011-06-12 DIAGNOSIS — R102 Pelvic and perineal pain: Secondary | ICD-10-CM

## 2011-06-12 DIAGNOSIS — D251 Intramural leiomyoma of uterus: Secondary | ICD-10-CM

## 2011-06-12 NOTE — Progress Notes (Signed)
Patient came back today to have an ultrasound because of left lower quadrant pain. She says it feels like she is going to start a period but she's had no bleeding. She has had no urinary symptoms. Her urinalysis here 2 weeks ago was normal. She has bowel problems but this feels different. She has no dyspareunia. 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 This is slightly bigger than it was in 2010. Her endometrial echo is 2.7 mm. Both her ovaries are normal. She does have a lot of cul-de-sac fluid.  Assessment: Left lower quadrant pain. Fibroid.  Plan: The patient stated that she felt somewhat better since her last visit. I suspect she's ruptured ovarian cyst. If the pain persists one more week she will go see her GI doctor who is [name].

## 2011-06-27 ENCOUNTER — Telehealth: Payer: Self-pay | Admitting: *Deleted

## 2011-06-27 NOTE — Telephone Encounter (Signed)
It is custom care. The prescription is estradiol vaginal cream 0.02%. 1 mL prefilled applicators. Insert one applicator in vagina 3 times a week. Give her 3 month supply with refills for one year.

## 2011-06-27 NOTE — Telephone Encounter (Signed)
rx called into pharmacy, pt informed as well.

## 2011-06-27 NOTE — Telephone Encounter (Signed)
Pt called to follow up regarding estradiol cream conversation from last OV. Pt contacted pharmacy and her insurance company and there is nonething cheaper for her to take. Pt mentioned that you spoke about another pharmacy? Was it [facility]? Will pt be taking estradiol(estrace) vaginal cream 0.1 mg? Please advise

## 2011-07-23 ENCOUNTER — Ambulatory Visit (INDEPENDENT_AMBULATORY_CARE_PROVIDER_SITE_OTHER): Payer: Managed Care, Other (non HMO)

## 2011-07-23 DIAGNOSIS — M949 Disorder of cartilage, unspecified: Secondary | ICD-10-CM

## 2011-07-23 DIAGNOSIS — M8580 Other specified disorders of bone density and structure, unspecified site: Secondary | ICD-10-CM

## 2012-03-25 DIAGNOSIS — D259 Leiomyoma of uterus, unspecified: Secondary | ICD-10-CM

## 2012-03-25 HISTORY — DX: Leiomyoma of uterus, unspecified: D25.9

## 2012-06-08 ENCOUNTER — Encounter: Payer: Managed Care, Other (non HMO) | Admitting: Gynecology

## 2012-06-09 ENCOUNTER — Encounter: Payer: Managed Care, Other (non HMO) | Admitting: Gynecology

## 2012-06-25 ENCOUNTER — Encounter: Payer: Self-pay | Admitting: Gynecology

## 2012-06-29 ENCOUNTER — Encounter: Payer: Self-pay | Admitting: Gynecology

## 2012-06-30 ENCOUNTER — Ambulatory Visit (INDEPENDENT_AMBULATORY_CARE_PROVIDER_SITE_OTHER): Payer: Managed Care, Other (non HMO) | Admitting: Gynecology

## 2012-06-30 ENCOUNTER — Encounter: Payer: Self-pay | Admitting: Gynecology

## 2012-06-30 VITALS — BP 106/68 | Ht 65.0 in | Wt 120.0 lb

## 2012-06-30 DIAGNOSIS — Z01419 Encounter for gynecological examination (general) (routine) without abnormal findings: Secondary | ICD-10-CM

## 2012-06-30 DIAGNOSIS — Z780 Asymptomatic menopausal state: Secondary | ICD-10-CM

## 2012-06-30 DIAGNOSIS — M899 Disorder of bone, unspecified: Secondary | ICD-10-CM

## 2012-06-30 DIAGNOSIS — N952 Postmenopausal atrophic vaginitis: Secondary | ICD-10-CM

## 2012-06-30 DIAGNOSIS — M8580 Other specified disorders of bone density and structure, unspecified site: Secondary | ICD-10-CM

## 2012-06-30 LAB — CBC WITH DIFFERENTIAL/PLATELET
Basophils Absolute: 0 10*3/uL (ref 0.0–0.1)
Basophils Relative: 1 % (ref 0–1)
Eosinophils Absolute: 0.2 10*3/uL (ref 0.0–0.7)
Hemoglobin: 14.6 g/dL (ref 12.0–15.0)
MCH: 32.3 pg (ref 26.0–34.0)
MCHC: 35 g/dL (ref 30.0–36.0)
Monocytes Relative: 12 % (ref 3–12)
Neutro Abs: 1.5 10*3/uL — ABNORMAL LOW (ref 1.7–7.7)
Neutrophils Relative %: 35 % — ABNORMAL LOW (ref 43–77)
Platelets: 204 10*3/uL (ref 150–400)
RDW: 13.6 % (ref 11.5–15.5)

## 2012-06-30 LAB — LIPID PANEL
Cholesterol: 221 mg/dL — ABNORMAL HIGH (ref 0–200)
Triglycerides: 52 mg/dL (ref <150)
VLDL: 10 mg/dL (ref 0–40)

## 2012-06-30 LAB — COMPREHENSIVE METABOLIC PANEL
Albumin: 4.7 g/dL (ref 3.5–5.2)
Alkaline Phosphatase: 48 U/L (ref 39–117)
BUN: 11 mg/dL (ref 6–23)
CO2: 27 mEq/L (ref 19–32)
Calcium: 10.4 mg/dL (ref 8.4–10.5)
Chloride: 104 mEq/L (ref 96–112)
Glucose, Bld: 98 mg/dL (ref 70–99)
Potassium: 4.4 mEq/L (ref 3.5–5.3)
Sodium: 142 mEq/L (ref 135–145)
Total Protein: 7 g/dL (ref 6.0–8.3)

## 2012-06-30 MED ORDER — NONFORMULARY OR COMPOUNDED ITEM: Status: DC

## 2012-06-30 NOTE — Patient Instructions (Signed)
Shingles Vaccine What You Need to Know WHAT IS SHINGLES?  Shingles is a painful skin rash, often with blisters. It is also called Herpes Zoster or just Zoster.  A shingles rash usually appears on one side of the face or body and lasts from 2 to 4 weeks. Its main symptom is pain, which can be quite severe. Other symptoms of shingles can include fever, headache, chills, and upset stomach. Very rarely, a shingles infection can lead to pneumonia, hearing problems, blindness, brain inflammation (encephalitis), or death.  For about 1 person in 5, severe pain can continue even after the rash clears up. This is called post-herpetic neuralgia.  Shingles is caused by the Varicella Zoster virus. This is the same virus that causes chickenpox. Only someone who has had a case of chickenpox or rarely, has gotten chickenpox vaccine, can get shingles. The virus stays in your body. It can reappear many years later to cause a case of shingles.  You cannot catch shingles from another person with shingles. However, a person who has never had chickenpox (or chickenpox vaccine) could get chickenpox from someone with shingles. This is not very common.  Shingles is far more common in people 50 and older than in younger people. It is also more common in people whose immune systems are weakened because of a disease such as cancer or drugs such as steroids or chemotherapy.  At least 1 million people get shingles per year in the [place]. SHINGLES VACCINE  A vaccine for shingles was licensed in 2006. In clinical trials, the vaccine reduced the risk of shingles by 50%. It can also reduce the pain in people who still get shingles after being vaccinated.  A single dose of shingles vaccine is recommended for adults 60 years of age and older. SOME PEOPLE SHOULD NOT GET SHINGLES VACCINE OR SHOULD WAIT A person should not get shingles vaccine if he or she:  Has ever had a life-threatening allergic reaction to gelatin, the  antibiotic neomycin, or any other component of shingles vaccine. Tell your caregiver if you have any severe allergies.  Has a weakened immune system because of current:  AIDS or another disease that affects the immune system.  Treatment with drugs that affect the immune system, such as prolonged use of high-dose steroids.  Cancer treatment, such as radiation or chemotherapy.  Cancer affecting the bone marrow or lymphatic system, such as leukemia or lymphoma.  Is pregnant, or might be pregnant. Women should not become pregnant until at least 4 weeks after getting shingles vaccine. Someone with a minor illness, such as a cold, may be vaccinated. Anyone with a moderate or severe acute illness should usually wait until he or she recovers before getting the vaccine. This includes anyone with a temperature of 101.3 F (38 C) or higher. WHAT ARE THE RISKS FROM SHINGLES VACCINE?  A vaccine, like any medicine, could possibly cause serious problems, such as severe allergic reactions. However, the risk of a vaccine causing serious harm, or death, is extremely small.  No serious problems have been identified with shingles vaccine. Mild Problems  Redness, soreness, swelling, or itching at the site of the injection (about 1 person in 3).  Headache (about 1 person in 70). Like all vaccines, shingles vaccine is being closely monitored for unusual or severe problems. WHAT IF THERE IS A MODERATE OR SEVERE REACTION? What should I look for? Any unusual condition, such as a severe allergic reaction or a high fever. If a severe allergic reaction   occurred, it would be within a few minutes to an hour after the shot. Signs of a serious allergic reaction can include difficulty breathing, weakness, hoarseness or wheezing, a fast heartbeat, hives, dizziness, paleness, or swelling of the throat. What should I do?  Call your caregiver, or get the person to a caregiver right away.  Tell the caregiver what  happened, the date and time it happened, and when the vaccination was given.  Ask the caregiver to report the reaction by filing a Vaccine Adverse Event Reporting System (VAERS) form. Or, you can file this report through the VAERS web site at www.vaers.hhs.gov or by calling 1-800-822-7967. VAERS does not provide medical advice. HOW CAN I LEARN MORE?  Ask your caregiver. He or she can give you the vaccine package insert or suggest other sources of information.  Contact the Centers for Disease Control and Prevention (CDC):  Call 1-800-232-4636 (1-800-CDC-INFO).  Visit the CDC website at www.cdc.gov/vaccines CDC Shingles Vaccine VIS (12/29/07) Document Released: 01/06/2006 Document Revised: 06/03/2011 Document Reviewed: 12/29/2007 [employer] Patient Information 2013 [employer].  

## 2012-06-30 NOTE — Progress Notes (Addendum)
Kathleen Arroyo 04-10-1955 [id number]   History:    57 y.o.  for annual gyn exam With no complaints today. Patient has suffered from vaginal atrophy in the past and has done well with estradiol vaginal cream twice a week. Review of patient's records indicated 2013 she had a bone density study with her lowest T score at the left hip with a value of -1.2 with significant decrease in bone mineral density from previous study but had normal Frax analysis. Patient is taking calcium vitamin D and has an active lifestyle. Patient with no prior history of abnormal Pap smears. Patient with negative colonoscopy in 2013. Patient states that in 2013 her PCP had administer her Tdap vaccine.  Patient's mother had history of breast cancer. Patient's mammogram was done today and she does her monthly self breast examination. Patient has had history in the past of ovarian cyst. Patient's father has had history of colon cancer so she is having colonoscopies every 5 years.  Past medical history,surgical history, family history and social history were all reviewed and documented in the EPIC chart.  Gynecologic History No LMP recorded. Patient is postmenopausal. Contraception: post menopausal status Last Pap: 2013. Results were: normal Last mammogram: today. Results were: results pending  Obstetric History OB History   Grav Para Term Preterm Abortions TAB SAB Ect Mult Living   3 1 1  2     1      # Outc Date GA Lbr Len/2nd Wgt Sex Del Anes PTL Lv   1 ABT            2 ABT            3 TRM                ROS: A ROS was performed and pertinent positives and negatives are included in the history.  GENERAL: No fevers or chills. HEENT: No change in vision, no earache, sore throat or sinus congestion. NECK: No pain or stiffness. CARDIOVASCULAR: No chest pain or pressure. No palpitations. PULMONARY: No shortness of breath, cough or wheeze. GASTROINTESTINAL: No abdominal pain, nausea, vomiting or diarrhea, melena or  bright red blood per rectum. GENITOURINARY: No urinary frequency, urgency, hesitancy or dysuria. MUSCULOSKELETAL: No joint or muscle pain, no back pain, no recent trauma. DERMATOLOGIC: No rash, no itching, no lesions. ENDOCRINE: No polyuria, polydipsia, no heat or cold intolerance. No recent change in weight. HEMATOLOGICAL: No anemia or easy bruising or bleeding. NEUROLOGIC: No headache, seizures, numbness, tingling or weakness. PSYCHIATRIC: No depression, no loss of interest in normal activity or change in sleep pattern.     Exam: chaperone present  BP 106/68  Ht 5\' 5"  (1.651 m)  Wt 120 lb (54.432 kg)  BMI 19.97 kg/m2  Body mass index is 19.97 kg/(m^2).  General appearance : Well developed well nourished female. No acute distress HEENT: Neck supple, trachea midline, no carotid bruits, no thyroidmegaly Lungs: Clear to auscultation, no rhonchi or wheezes, or rib retractions  Heart: Regular rate and rhythm, no murmurs or gallops Breast:Examined in sitting and supine position were symmetrical in appearance, no palpable masses or tenderness,  no skin retraction, no nipple inversion, no nipple discharge, no skin discoloration, no axillary or supraclavicular lymphadenopathy Abdomen: no palpable masses or tenderness, no rebound or guarding Extremities: no edema or skin discoloration or tenderness  Pelvic:  Bartholin, Urethra, Skene Glands:              Clitoral hood leukoplakic area  Vagina: No gross lesions or discharge,slight vaginal atrophy  Cervix: No gross lesions or discharge  Uterus  anteverted, normal size, shape and consistency, non-tender and mobile  Adnexa  Without masses or tenderness  Anus and perineum  normal   Rectovaginal  normal sphincter tone without palpated masses or tenderness             Hemoccult cards provided     Assessment/Plan:  57 y.o. female for annual exam with incidental finding of periclitoral fluid leukoplakic area. Patient will return back to  the office next week for colposcopic evaluation and biopsy. The following labs were today: Fasting lipid profile, comprehensive metabolic panel, CBC, TSH, and urinalysis. No Pap smear done today the new guidelines were discussed. Hemoccult card provided for the patient to submit to the office for testing. She was provided with a prescription for shingles vaccine and a refill for her vaginal estradiol was provided.    Ok Edwards MD, 10:25 AM 06/30/2012

## 2012-07-01 LAB — URINALYSIS W MICROSCOPIC + REFLEX CULTURE
Casts: NONE SEEN
Glucose, UA: NEGATIVE mg/dL
Leukocytes, UA: NEGATIVE
Nitrite: NEGATIVE
Specific Gravity, Urine: 1.018 (ref 1.005–1.030)
pH: 6.5 (ref 5.0–8.0)

## 2012-07-02 ENCOUNTER — Other Ambulatory Visit: Payer: Self-pay | Admitting: Gynecology

## 2012-07-02 DIAGNOSIS — E039 Hypothyroidism, unspecified: Secondary | ICD-10-CM

## 2012-07-07 ENCOUNTER — Encounter: Payer: Self-pay | Admitting: Gynecology

## 2012-07-07 ENCOUNTER — Ambulatory Visit (INDEPENDENT_AMBULATORY_CARE_PROVIDER_SITE_OTHER): Payer: Managed Care, Other (non HMO) | Admitting: Gynecology

## 2012-07-07 VITALS — BP 130/84

## 2012-07-07 DIAGNOSIS — E039 Hypothyroidism, unspecified: Secondary | ICD-10-CM

## 2012-07-07 DIAGNOSIS — N9089 Other specified noninflammatory disorders of vulva and perineum: Secondary | ICD-10-CM

## 2012-07-07 LAB — THYROID PANEL WITH TSH
T4, Total: 7.6 ug/dL (ref 5.0–12.5)
TSH: 7.024 u[IU]/mL — ABNORMAL HIGH (ref 0.350–4.500)

## 2012-07-07 MED ORDER — HYDROCODONE-ACETAMINOPHEN 5-325 MG PO TABS: 1.0000 | ORAL_TABLET | Freq: Four times a day (QID) | ORAL | Status: DC | PRN

## 2012-07-07 NOTE — Patient Instructions (Signed)
Lichen Sclerosus Lichen sclerosus is a skin problem. It can happen on any part of the body. It happens most often in the anal or genital areas. Girls and women get lichen sclerosus more often than boys and men. The cause is not known. This skin problem is not passed from one person to another. HOME CARE  Only take medicines as told by your doctor.  Keep the vagina as clean and dry as you can. GET HELP RIGHT AWAY IF: Your pain, puffiness (swelling), or redness gets worse. MAKE SURE YOU:  Understand these instructions.  Will watch your condition.  Will get help right away if you are not doing well or get worse. Document Released: 02/22/2008 Document Revised: 06/03/2011 Document Reviewed: 07/13/2010 [facility] Patient Information 2013 [place], [place].

## 2012-07-07 NOTE — Progress Notes (Signed)
Patient was seen in the office on April 8 her annual gynecological examination and an incidental finding during pelvic examination had demonstrated a periurethral leukoplakic area. Patient had complained of irritation from that area. She had been using estradiol vaginal cream twice a week intravaginally for vaginal dryness. She presented today for colposcopic evaluation and biopsy of this area.  Colposcopic evaluation: The external genitalia perineum and perirectal region were inspected a leukoplakic area was noted above the urethra and inferior to the clitoris from about the 10 to 2:00 position and was flat. Speculum was inserted no lesions were noted in the vagina cervix or fornices.  1% lidocaine was infiltrated subcutaneously underneath the leukoplakic area above the urethra. With fine pickups and scissors a small biopsy was obtained and submitted for histological evaluation. Silver nitrate was used for hemostasis. 2% lidocaine was applied for relief. Patient was given 2 Aleve upon completion.  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

## 2012-07-07 NOTE — Addendum Note (Signed)
Addended by: Bertram Savin A on: 07/07/2012 04:15 PM   Modules accepted: Orders

## 2012-07-09 ENCOUNTER — Other Ambulatory Visit: Payer: Self-pay | Admitting: Anesthesiology

## 2012-07-09 DIAGNOSIS — Z1211 Encounter for screening for malignant neoplasm of colon: Secondary | ICD-10-CM

## 2012-07-13 ENCOUNTER — Other Ambulatory Visit: Payer: Self-pay | Admitting: Gynecology

## 2012-07-13 ENCOUNTER — Telehealth: Payer: Self-pay

## 2012-07-13 ENCOUNTER — Encounter: Payer: Self-pay | Admitting: Gynecology

## 2012-07-13 DIAGNOSIS — E039 Hypothyroidism, unspecified: Secondary | ICD-10-CM

## 2012-07-13 MED ORDER — CLOBETASOL PROPIONATE 0.05 % EX OINT: TOPICAL_OINTMENT | CUTANEOUS | Status: DC

## 2012-07-13 NOTE — Telephone Encounter (Signed)
Message copied by Keenan Bachelor on Mon Jul 13, 2012  4:46 PM ------      Message from: Ok Edwards      Created: Fri Jul 10, 2012  8:13 PM       Please inform that her pathology report was benign. Features suggestive of Lichen Sclerosus. I would like to prescribe the following:            Clobdetasol Propionate 0.05% apply a thin film to area biopsied before bedtime for six weeks and then apply 1-2 per week for a few years. I will need to see her after the six weeks of treatment to see response to treatment.            Her thyroid function test had one of the parameters  (TSH) slightly elevated which may indicate an early subacute hypothyroidism which we will need to repeat (Thyroid panel in 3 months) to make sure she does not develop clinical hypothyroidism which then would need to be treated. She needs no treatment right now.             ------

## 2012-07-13 NOTE — Telephone Encounter (Signed)
Patient was informed of below.  She had two questions:  1.  You had told her at visit to return in two weeks and she has that scheduled. She asked should she still keep that as well as the six week follow-up you recommended below?  2.  Patient asked if you still want her to use the Estradiol 0.02% cream as prescribed at Office visit?

## 2012-07-13 NOTE — Telephone Encounter (Signed)
The estradiol vaginal cream she can continue to apply INSIDE vagina TWICE a week. The Clobetasol (thin film) periclitoral area where biopsy was obtained every night before bedtime for six weeks. I would like to see her for follow up in 4 weeks instead of two. Lets go ahead and repeat her thyroid panel then. Thanks

## 2012-07-14 NOTE — Telephone Encounter (Signed)
Patient informed.  Dr. JF-On lab results you had said recheck TSH in 3 mos and in this note you mentioned Thyroid Panel recheck. Just double checking what to order.

## 2012-07-14 NOTE — Telephone Encounter (Signed)
Left message for patient to call.

## 2012-07-14 NOTE — Telephone Encounter (Signed)
Thyroid panel in 3 months.

## 2012-07-15 ENCOUNTER — Other Ambulatory Visit: Payer: Self-pay | Admitting: Gynecology

## 2012-07-15 DIAGNOSIS — E039 Hypothyroidism, unspecified: Secondary | ICD-10-CM

## 2012-07-15 NOTE — Telephone Encounter (Signed)
Patient was informed to return in 4 weeks and that Dr. Glenetta Hew wanted to recheck Thyroid panel then.  Appt scheduled.

## 2012-07-27 ENCOUNTER — Ambulatory Visit: Payer: Managed Care, Other (non HMO) | Admitting: Gynecology

## 2012-08-10 ENCOUNTER — Encounter: Payer: Self-pay | Admitting: Gynecology

## 2012-08-10 ENCOUNTER — Ambulatory Visit (INDEPENDENT_AMBULATORY_CARE_PROVIDER_SITE_OTHER): Payer: Managed Care, Other (non HMO) | Admitting: Gynecology

## 2012-08-10 VITALS — BP 136/86

## 2012-08-10 DIAGNOSIS — N904 Leukoplakia of vulva: Secondary | ICD-10-CM

## 2012-08-10 DIAGNOSIS — R102 Pelvic and perineal pain: Secondary | ICD-10-CM

## 2012-08-10 DIAGNOSIS — E039 Hypothyroidism, unspecified: Secondary | ICD-10-CM

## 2012-08-10 DIAGNOSIS — L940 Localized scleroderma [morphea]: Secondary | ICD-10-CM

## 2012-08-10 DIAGNOSIS — N949 Unspecified condition associated with female genital organs and menstrual cycle: Secondary | ICD-10-CM

## 2012-08-10 NOTE — Patient Instructions (Addendum)

## 2012-08-10 NOTE — Progress Notes (Signed)
Patient was seen in the office on April 8 her annual gynecological examination and an incidental finding during pelvic examination had demonstrated a periurethral leukoplakic area. Patient had complained of irritation from that area. She had been using estradiol vaginal cream twice a week intravaginally for vaginal dryness.   On 07/07/2012 patient with a colposcopic examination of the external genitalia and a a leukoplakic area was noted above the urethra and inferior to the clitoris from about the 10 to 2:00 position and was flat was noted and a biopsy obtained.  Pathology report demonstrated the following: Vulva, biopsy, peri urethral - BENIGN HYPERKERATOTIC SQUAMOUS MUCOSA, SEE COMMENT. - NO DYSPLASIA OR MALIGNANCY IDENTIFIED. Microscopic Comment There are focal dermal changes suggestive of lichen sclerosus et atrophicus although traumatic irritation is also a possibility. Please correlate with clinical impression. There is no dysplasia or malignancy identified.  Exam: Area previously biopsy has healed. Patient had been placed on clobetasol since last seen after the results of the biopsy came back.  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 She will continue to apply the clobetasol each bedtime to complete 6 weeks and then apply one to twice a week for one year. I would like to see her back in 6 months. She was complaining of left lower quadrant discomfort to start her past few days. On exam she was difficult to examine because some mild guarding. Although her abdominal exam there was no rebound or guarding. Patient was scheduled ultrasound for later this week for further followup. Her thyroid function test had one of the parameters (TSH) slightly elevated which may indicate an early subacute hypothyroidism which we will need to repeat (Thyroid panel in 3 months) to make sure she does not develop clinical hypothyroidism which then would need to be  treated. She needs no treatment right now. Patient wanted to have her thyroid panel repeated today sample drawn result pending at time of this dictation. She was reminded that she needs to followup with her primary physician because of her elevated LDL.

## 2012-08-11 LAB — THYROID PANEL WITH TSH
T3 Uptake: 32.9 % (ref 22.5–37.0)
T4, Total: 7.6 ug/dL (ref 5.0–12.5)
TSH: 25.874 u[IU]/mL — ABNORMAL HIGH (ref 0.350–4.500)

## 2012-08-12 ENCOUNTER — Telehealth: Payer: Self-pay | Admitting: *Deleted

## 2012-08-12 NOTE — Telephone Encounter (Signed)
Referral faxed to Dr.Balan office they will fax office with time and date.

## 2012-08-12 NOTE — Telephone Encounter (Signed)
Message copied by Aura Camps on Wed Aug 12, 2012  9:54 AM ------      Message from: Keenan Bachelor      Created: Tue Aug 11, 2012 12:02 PM      Regarding: Dr. Talmage Nap referral       Please inform patient that for the third time in a row in the past month and a half her TSH continues to be elevated. Normal range of TSH is 0.3-4.5 and her value yesterday was 25.87. I would like to refer her to the endocrinologist Dr. Lurene Shadow for further evaluation. Please make an appointment for her and fax her all her thyroid panel blood test                  Left patient a detailed message with above. Told her she will hear from you later with appt date/time. ------

## 2012-08-13 NOTE — Telephone Encounter (Signed)
Appt. On 09/22/12 pt informed with this as well.

## 2012-08-14 ENCOUNTER — Ambulatory Visit (INDEPENDENT_AMBULATORY_CARE_PROVIDER_SITE_OTHER): Payer: Managed Care, Other (non HMO)

## 2012-08-14 ENCOUNTER — Ambulatory Visit (INDEPENDENT_AMBULATORY_CARE_PROVIDER_SITE_OTHER): Payer: Managed Care, Other (non HMO) | Admitting: Gynecology

## 2012-08-14 ENCOUNTER — Encounter: Payer: Self-pay | Admitting: Gynecology

## 2012-08-14 VITALS — BP 130/72

## 2012-08-14 DIAGNOSIS — N949 Unspecified condition associated with female genital organs and menstrual cycle: Secondary | ICD-10-CM

## 2012-08-14 DIAGNOSIS — R102 Pelvic and perineal pain: Secondary | ICD-10-CM

## 2012-08-14 DIAGNOSIS — D259 Leiomyoma of uterus, unspecified: Secondary | ICD-10-CM

## 2012-08-14 DIAGNOSIS — K589 Irritable bowel syndrome without diarrhea: Secondary | ICD-10-CM | POA: Insufficient documentation

## 2012-08-14 MED ORDER — DICYCLOMINE HCL 10 MG PO CAPS: ORAL_CAPSULE | ORAL | Status: DC

## 2012-08-14 NOTE — Progress Notes (Signed)
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 Patient has been responding to clobetasol and was to followup in 6 months.  She's here today for an ultrasound due to the fact that in the last visit she was mentioned and she was having left lower quadrant discomfort. She has had history in the past of constipation and irritable bowel syndrome. Her ultrasound today demonstrated the following:  Uterus measured 8.4 x 5.4 x 4.1 cm with endometrial stripe of 4.5 mm. 2 small fibroids measuring as follows: 18 x 17 mm, 22 x 17 mm. Ovaries appeared to be normal. Small amount of fluid near the left ovary 1811 mm otherwise normal.  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 #2 left lower quadrant discomfort with history of constipation and history of IBS. Normal ultrasound with exception of 2 small fibroids. For her IBS she was instructed to take MiraLax 1 tablespoon with juice daily. She will also be prescribed Bentyl 10 mg to take one by mouth 3-4 times a day with meals when necessary. If her symptoms don't improve she will follow up with her gastroenterologist.

## 2012-08-14 NOTE — Patient Instructions (Addendum)
Dicyclomine tablets or capsules What is this medicine? DICYCLOMINE (dye SYE kloe meen) is used to treat bowel problems including irritable bowel syndrome. This medicine may be used for other purposes; ask your health care provider or pharmacist if you have questions. What should I tell my health care provider before I take this medicine? They need to know if you have any of these conditions: -difficulty passing urine -esophagus problems or heartburn -glaucoma -heart disease, or previous heart attack -myasthenia gravis -prostate trouble -stomach infection, or obstruction -ulcerative colitis -an unusual or allergic reaction to dicyclomine, other medicines, foods, dyes, or preservatives -pregnant or trying to get pregnant -breast-feeding How should I use this medicine? Take this medicine by mouth with a glass of water. Follow the directions on the prescription label. It is best to take this medicine on an empty stomach, 30 minutes to 1 hour before meals. Take your medicine at regular intervals. Do not take your medicine more often than directed. Talk to your pediatrician regarding the use of this medicine in children. Special care may be needed. While this drug may be prescribed for children as young as 27 months of age for selected conditions, precautions do apply. Patients over 39 years old may have a stronger reaction and need a smaller dose. Overdosage: If you think you have taken too much of this medicine contact a poison control center or emergency room at once. NOTE: This medicine is only for you. Do not share this medicine with others. What if I miss a dose? If you miss a dose, take it as soon as you can. If it is almost time for your next dose, take only that dose. Do not take double or extra doses. What may interact with this medicine? -amantadine -antacids -benztropine -digoxin -disopyramide -medicines for allergies, colds and breathing difficulties -medicines for alzheimer's  disease -medicines for anxiety or sleeping problems -medicines for depression or psychotic disturbances -medicines for diarrhea -medicines for pain -metoclopramide -tegaserod This list may not describe all possible interactions. Give your health care provider a list of all the medicines, herbs, non-prescription drugs, or dietary supplements you use. Also tell them if you smoke, drink alcohol, or use illegal drugs. Some items may interact with your medicine. What should I watch for while using this medicine? You may get drowsy, dizzy, or have blurred vision. Do not drive, use machinery, or do anything that needs mental alertness until you know how this medicine affects you. To reduce the risk of dizzy or fainting spells, do not sit or stand up quickly, especially if you are an older patient. Alcohol can make you more drowsy, avoid alcoholic drinks. Stay out of bright light and wear sunglasses if this medicine makes your eyes more sensitive to light. Avoid extreme heat (hot tubs, saunas). This medicine can cause you to sweat less than normal. Your body temperature could increase to dangerous levels, which may lead to heat stroke. Antacids can stop this medicine from working. If you get an upset stomach and want to take an antacid, make sure there is an interval of at least 1 to 2 hours before or after you take this medicine. Your mouth may get dry. Chewing sugarless gum or sucking hard candy, and drinking plenty of water may help. Contact your doctor if the problem does not go away or is severe. What side effects may I notice from receiving this medicine? Side effects that you should report to your doctor or health care professional as soon as possible: -agitation, nervousness,  confusion -difficulty swallowing -dizziness, drowsiness -fast or slow heartbeat -hallucinations -pain or difficulty passing urine Side effects that usually do not require medical attention (report to your doctor or health  care professional if they continue or are bothersome): -constipation -headache -nausea or vomiting -sexual difficulty This list may not describe all possible side effects. Call your doctor for medical advice about side effects. You may report side effects to FDA at 1-800-FDA-1088. Where should I keep my medicine? Keep out of the reach of children. Store at room temperature below 30 degrees C (86 degrees F). Protect from light. Throw away any unused medicine after the expiration date. NOTE: This sheet is a summary. It may not cover all possible information. If you have questions about this medicine, talk to your doctor, pharmacist, or health care provider.  2013, [employer]vier/Gold Standard. (06/30/2007 5:12:34 PM)  Polyethylene Glycol powder (Miralax) What is this medicine? POLYETHYLENE GLYCOL 3350 (pol ee ETH i leen; GLYE col) powder is a laxative used to treat constipation. It increases the amount of water in the stool. Bowel movements become easier and more frequent. This medicine may be used for other purposes; ask your health care provider or pharmacist if you have questions. What should I tell my health care provider before I take this medicine? They need to know if you have any of these conditions: -a history of blockage of the stomach or intestine -current abdomen distension or pain -difficulty swallowing -diverticulitis, ulcerative colitis, or other chronic bowel disease -phenylketonuria -an unusual or allergic reaction to polyethylene glycol, other medicines, dyes, or preservatives -pregnant or trying to get pregnant -breast-feeding How should I use this medicine? Take this medicine by mouth. The bottle has a measuring cap that is marked with a line. Pour the powder into the cap up to the marked line (the dose is about 1 heaping tablespoon). Add the powder in the cap to a full glass (4 to 8 ounces or 120 to 240 ml) of water, juice, soda, coffee or tea. Mix the powder well. Drink the  solution. Take exactly as directed. Do not take your medicine more often than directed. Talk to your pediatrician regarding the use of this medicine in children. Special care may be needed. Overdosage: If you think you have taken too much of this medicine contact a poison control center or emergency room at once. NOTE: This medicine is only for you. Do not share this medicine with others. What if I miss a dose? If you miss a dose, take it as soon as you can. If it is almost time for your next dose, take only that dose. Do not take double or extra doses. What may interact with this medicine? Interactions are not expected. This list may not describe all possible interactions. Give your health care provider a list of all the medicines, herbs, non-prescription drugs, or dietary supplements you use. Also tell them if you smoke, drink alcohol, or use illegal drugs. Some items may interact with your medicine. What should I watch for while using this medicine? Do not use for more than 2 weeks without advice from your doctor or health care professional. It can take 2 to 4 days to have a bowel movement and to experience improvement in constipation. See your health care professional for any changes in bowel habits, including constipation, that are severe or last longer than three weeks. Always take this medicine with plenty of water. What side effects may I notice from receiving this medicine? Side effects that you should  report to your doctor or health care professional as soon as possible: -diarrhea -difficulty breathing -itching of the skin, hives, or skin rash -severe bloating, pain, or distension of the stomach -vomiting Side effects that usually do not require medical attention (report to your doctor or health care professional if they continue or are bothersome): -bloating or gas -lower abdominal discomfort or cramps -nausea This list may not describe all possible side effects. Call your doctor  for medical advice about side effects. You may report side effects to FDA at 1-800-FDA-1088. Where should I keep my medicine? Keep out of the reach of children. Store between 15 and 30 degrees C (59 and 86 degrees F). Throw away any unused medicine after the expiration date. NOTE: This sheet is a summary. It may not cover all possible information. If you have questions about this medicine, talk to your doctor, pharmacist, or health care provider.  2013, Elsevier/Gold Standard. (10/12/2007 4:50:45 PM) Irritable Bowel Syndrome Irritable Bowel Syndrome (IBS) is caused by a disturbance of normal bowel function. Other terms used are spastic colon, mucous colitis, and irritable colon. It does not require surgery, nor does it lead to cancer. There is no cure for IBS. But with proper diet, stress reduction, and medication, you will find that your problems (symptoms) will gradually disappear or improve. IBS is a common digestive disorder. It usually appears in late adolescence or early adulthood. Women develop it twice as often as men. CAUSES  After food has been digested and absorbed in the small intestine, waste material is moved into the colon (large intestine). In the colon, water and salts are absorbed from the undigested products coming from the small intestine. The remaining residue, or fecal material, is held for elimination. Under normal circumstances, gentle, rhythmic contractions on the bowel walls push the fecal material along the colon towards the rectum. In IBS, however, these contractions are irregular and poorly coordinated. The fecal material is either retained too long, resulting in constipation, or expelled too soon, producing diarrhea. SYMPTOMS  The most common symptom of IBS is pain. It is typically in the lower left side of the belly (abdomen). But it may occur anywhere in the abdomen. It can be felt as heartburn, backache, or even as a dull pain in the arms or shoulders. The pain comes from  excessive bowel-muscle spasms and from the buildup of gas and fecal material in the colon. This pain:  Can range from sharp belly (abdominal) cramps to a dull, continuous ache.  Usually worsens soon after eating.  Is typically relieved by having a bowel movement or passing gas. Abdominal pain is usually accompanied by constipation. But it may also produce diarrhea. The diarrhea typically occurs right after a meal or upon arising in the morning. The stools are typically soft and watery. They are often flecked with secretions (mucus). Other symptoms of IBS include:  Bloating.  Loss of appetite.  Heartburn.  Feeling sick to your stomach (nausea).  Belching  Vomiting  Gas. IBS may also cause a number of symptoms that are unrelated to the digestive system:  Fatigue.  Headaches.  Anxiety  Shortness of breath  Difficulty in concentrating.  Dizziness. These symptoms tend to come and go. DIAGNOSIS  The symptoms of IBS closely mimic the symptoms of other, more serious digestive disorders. So your caregiver may wish to perform a variety of additional tests to exclude these disorders. He/she wants to be certain of learning what is wrong (diagnosis). The nature and purpose of each  test will be explained to you. TREATMENT A number of medications are available to help correct bowel function and/or relieve bowel spasms and abdominal pain. Among the drugs available are:  Mild, non-irritating laxatives for severe constipation and to help restore normal bowel habits.  Specific anti-diarrheal medications to treat severe or prolonged diarrhea.  Anti-spasmodic agents to relieve intestinal cramps.  Your caregiver may also decide to treat you with a mild tranquilizer or sedative during unusually stressful periods in your life. The important thing to remember is that if any drug is prescribed for you, make sure that you take it exactly as directed. Make sure that your caregiver knows how well  it worked for you. HOME CARE INSTRUCTIONS   Avoid foods that are high in fat or oils. Some examples [id number] cream, butter, frankfurters, sausage, and other fatty meats.  Avoid foods that have a laxative effect, such as fruit, fruit juice, and dairy products.  Cut out carbonated drinks, chewing gum, and "gassy" foods, such as beans and cabbage. This may help relieve bloating and belching.  Bran taken with plenty of liquids may help relieve constipation.  Keep track of what foods seem to trigger your symptoms.  Avoid emotionally charged situations or circumstances that produce anxiety.  Start or continue exercising.  Get plenty of rest and sleep. MAKE SURE YOU:   Understand these instructions.  Will watch your condition.  Will get help right away if you are not doing well or get worse. Document Released: 03/11/2005 Document Revised: 06/03/2011 Document Reviewed: 10/30/2007 [facility] Patient Information 2014 [place], [place].

## 2012-10-10 IMAGING — US US ABDOMEN COMPLETE
1 series · 13 of 25 positions shown · non-contrast
Comparison: None

CLINICAL DATA: Abdominal pain and nausea.

COMPLETE ABDOMINAL ULTRASOUND

[Series 1: us abdomen complete · 0.30mm/px · 13 of 109 slices shown]
[im 1/109]
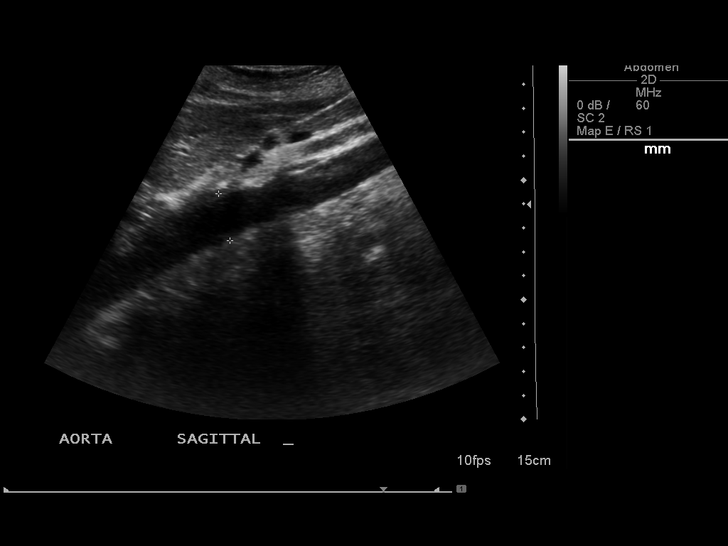
[im 10/109]
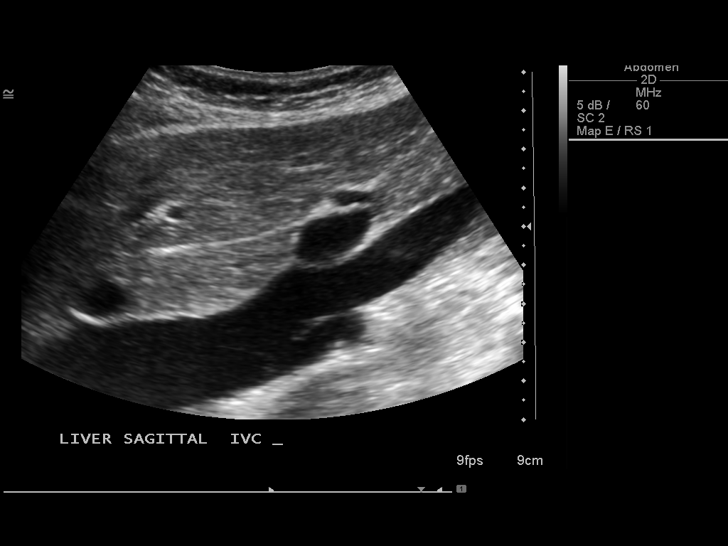
[im 19/109]
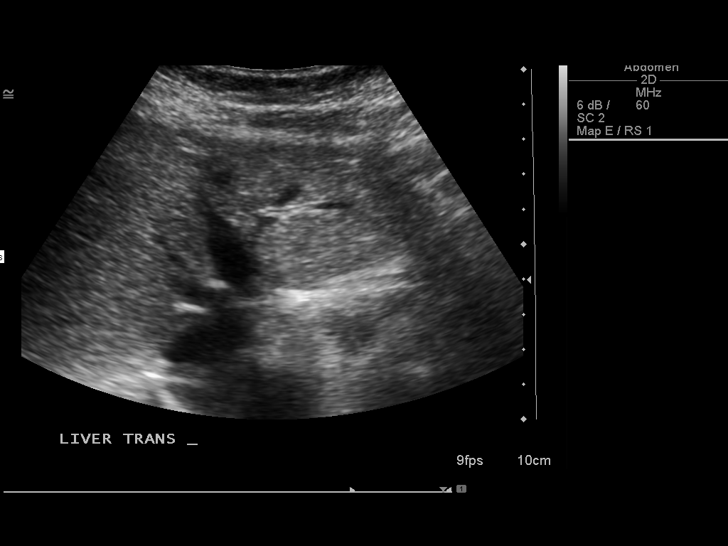
[im 28/109]
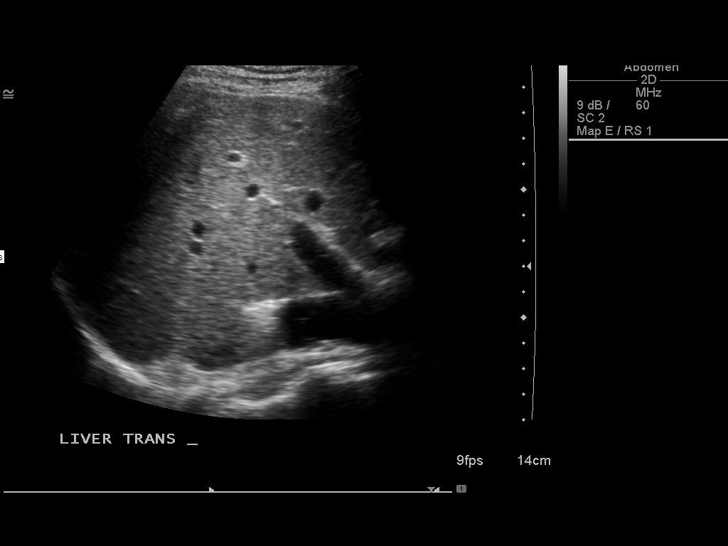
[im 37/109]
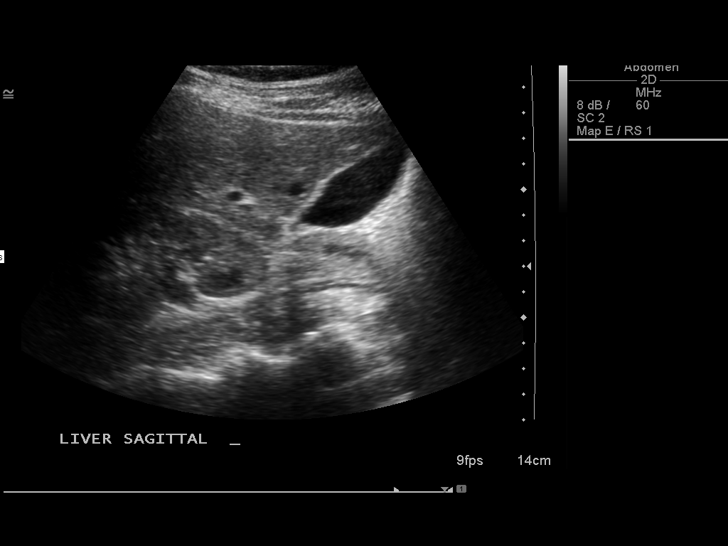
[im 46/109]
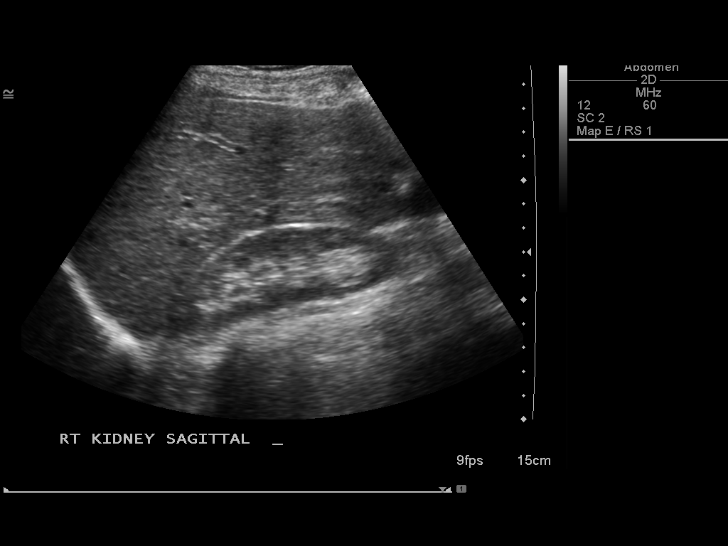
[im 55/109]
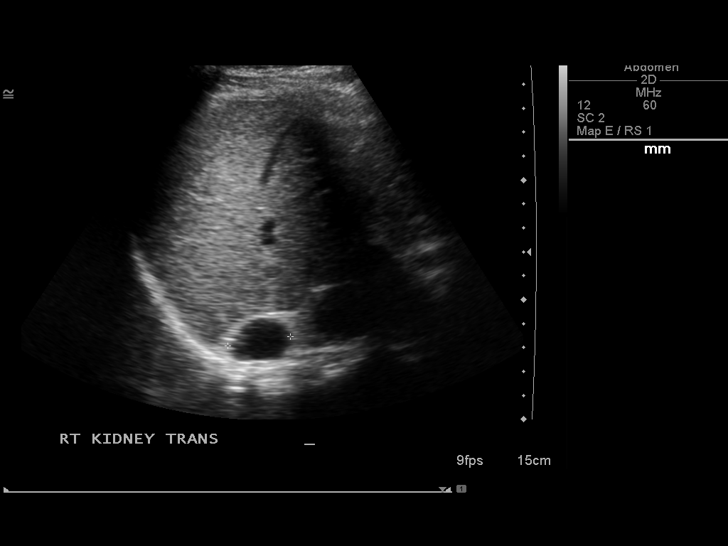
[im 64/109]
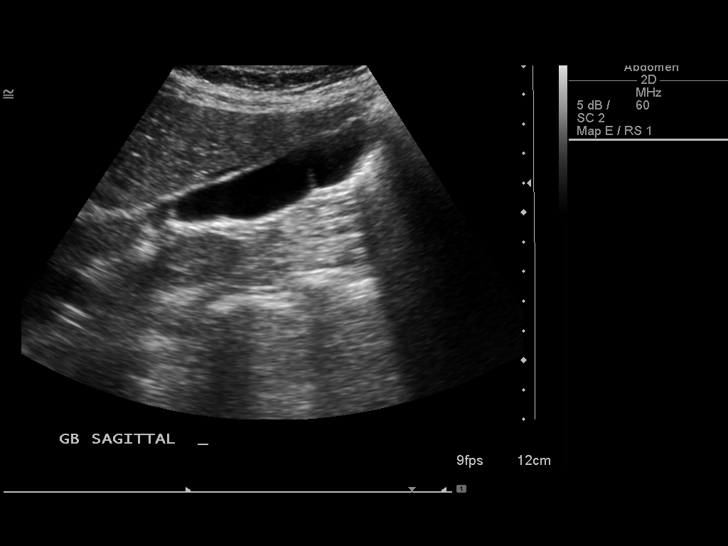
[im 73/109]
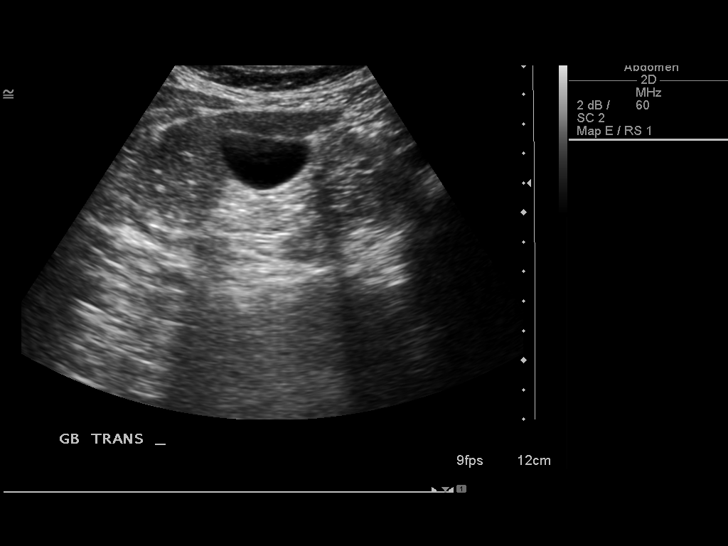
[im 82/109]
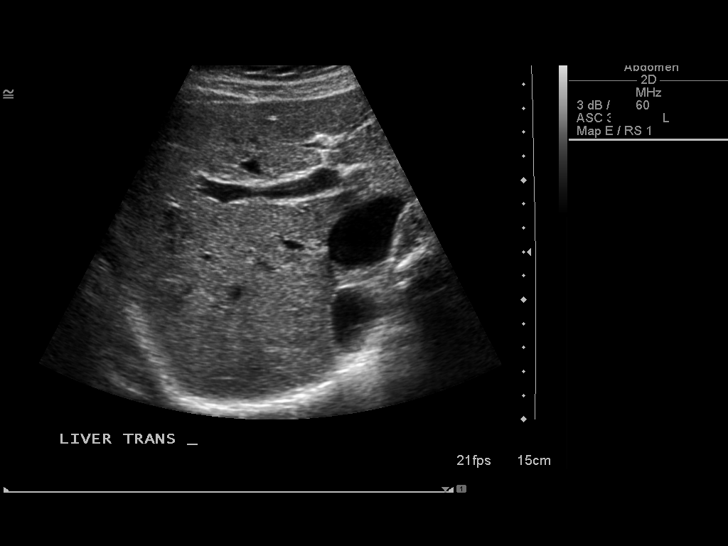
[im 91/109]
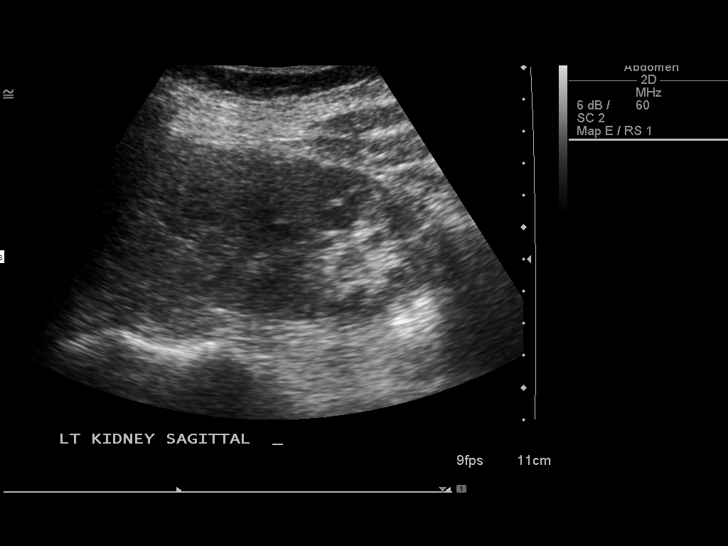
[im 100/109]
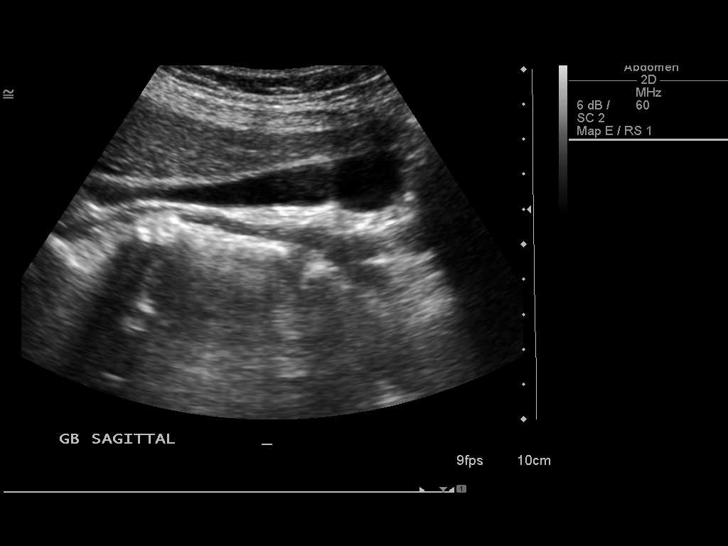
[im 109/109]
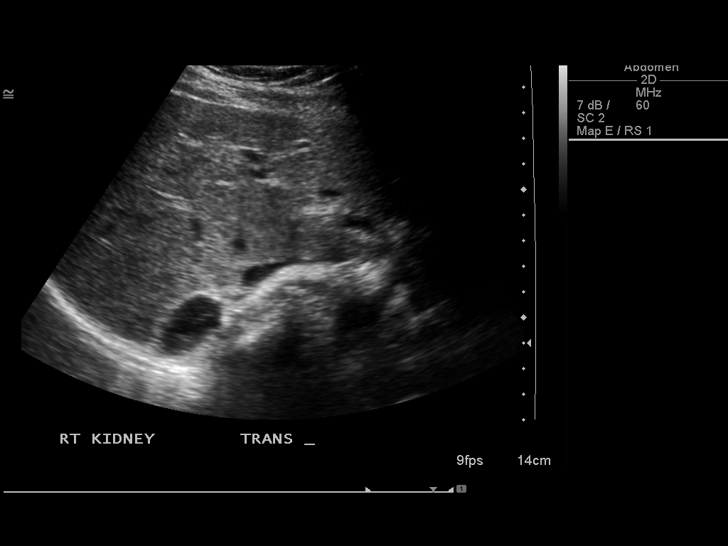

[13 of 25 positions shown; findings below may reference images not displayed]

FINDINGS: Gallbladder:  No gallstones, gallbladder wall thickening, or
pericholecystic fluid.

Common bile duct:  Normal in caliber measuring a maximum of 3.1mm.

Liver:  Normal hepatic echogenicity.  No biliary dilatation.  There
is a 9 mm hyperechoic lesion in the right lobe which is most likely
a benign hemangioma.  Between the liver and the right kidney is a
2.2 x 1.9 x 2.6 cm cystic area with a somewhat thickened wall and
possible calcifications.  This could reflect an adrenal lesion.  A
CT scan of the abdomen without and with contrast is recommended for
further evaluation.

IVC:  Normal caliber.

Pancreas:  Sonographically normal.

Spleen:  Normal size and echogenicity without focal lesions.

Right Kidney:  10.9 cm in length. Normal renal cortical thickness
and echogenicity without focal lesions or hydronephrosis.

Left Kidney:  12.1 cm in length. Normal renal cortical thickness
and echogenicity without focal lesions or hydronephrosis.

Abdominal aorta:  Normal caliber.
IMPRESSION: 1.  Normal sonographic appearance of the gallbladder and normal
caliber common bile duct.
2.  9 mm hyperechoic liver lesion is most likely a benign
hemangioma.
3.  2.2 x 1.9 x 2.6 cm cystic lesion between the liver and kidney
possibly reflecting an adrenal lesion.  A CT scan of the abdomen
without and with contrast is recommended for further evaluation.

## 2012-10-12 IMAGING — CT CT ABD-PEL WO/W CM
3 of 10 series · 12 of 46 positions shown, 18 images · IV contrast (agent unspecified)
Comparison: Abdomen ultrasound 11/21/2010

CLINICAL DATA: Abdominal pain and nausea.  Indeterminate right
upper quadrant cystic mass and liver mass seen on recent
ultrasound.

CT ABDOMEN AND PELVIS WITHOUT AND WITH CONTRAST
TECHNIQUE: Multidetector CT imaging of the abdomen and pelvis was
performed without contrast material in one or both body regions,
followed by contrast material(s) and further sections in one or
both body regions.
Contrast: 100 ml 7mnipaque-GEE and oral contrast

[Series 2: non_contrast 5.0 b30f st · axial · 0.68mm/px · z∈[-294,-249]mm · 2 of 47 slices shown]
[im 10/47  soft-tissue]
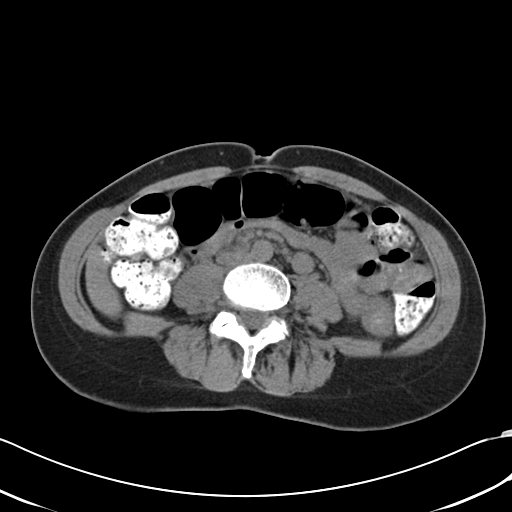
[im 19/47  soft-tissue]
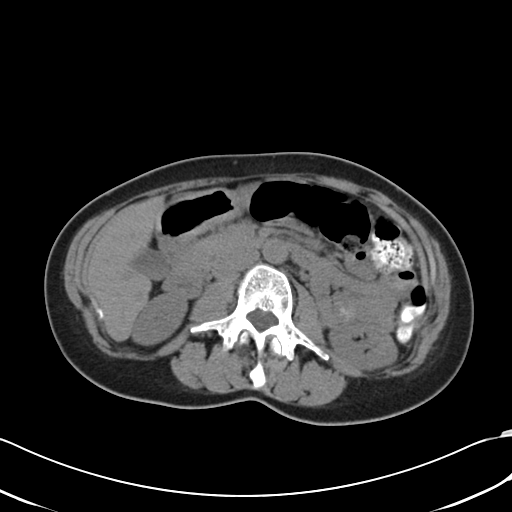

[Series 4: (id) 5.0 b30f · axial · 0.68mm/px · z∈[-460,-160]mm · 7 of 81 slices shown, 12 images]
[im 11/81  soft-tissue]
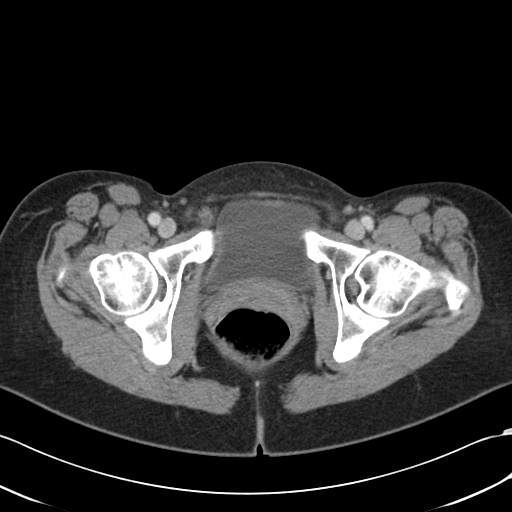
[im 11/81  bone]
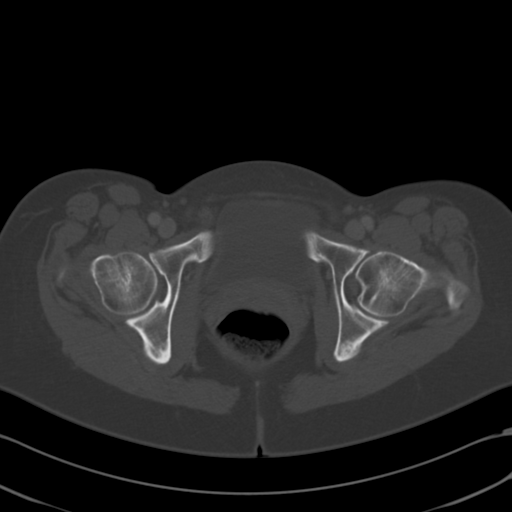
[im 21/81  soft-tissue]
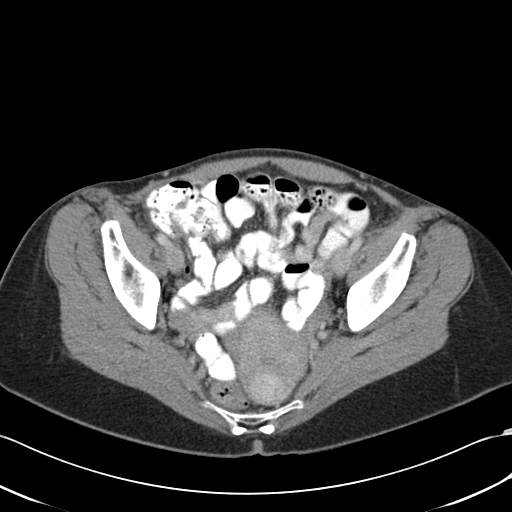
[im 31/81  soft-tissue]
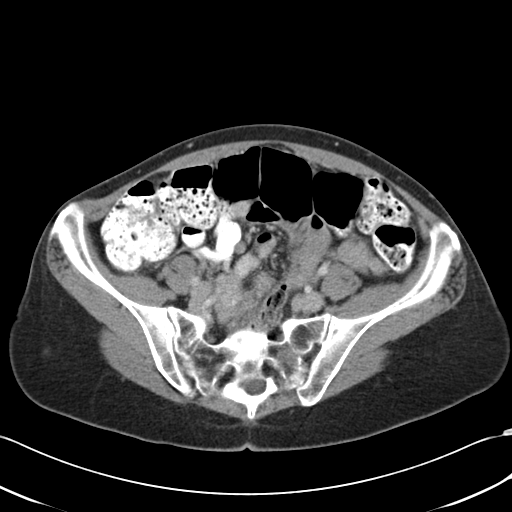
[im 41/81  soft-tissue]
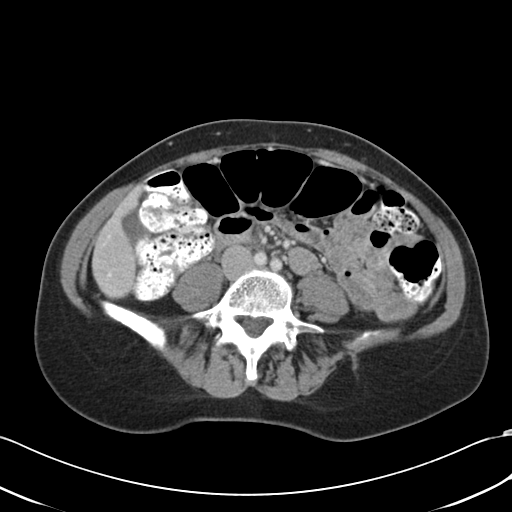
[im 41/81  lung]
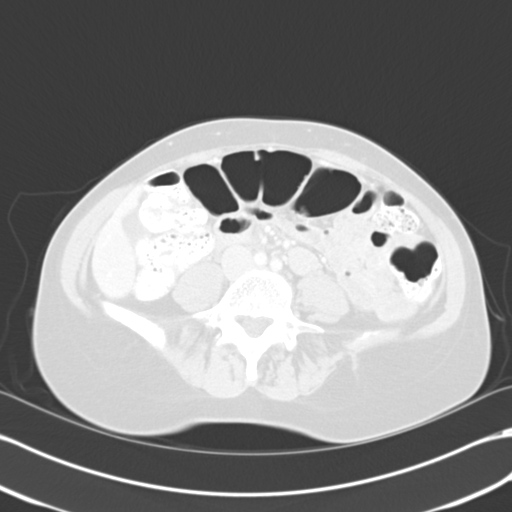
[im 51/81  soft-tissue]
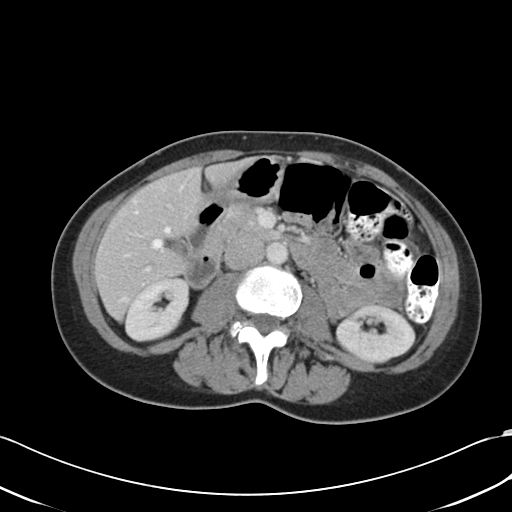
[im 51/81  lung]
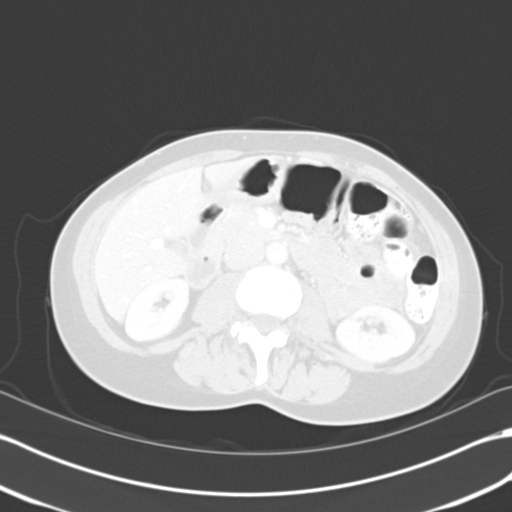
[im 61/81  soft-tissue]
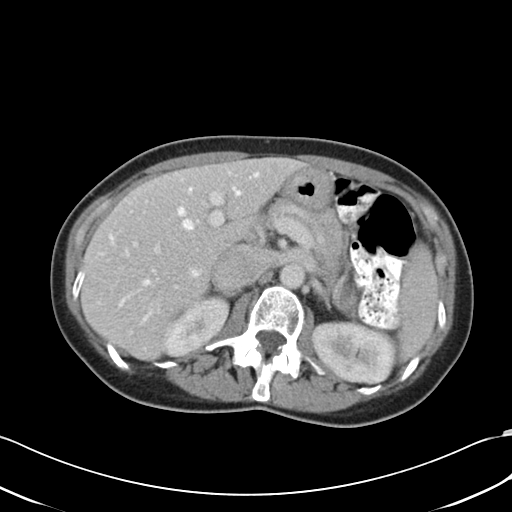
[im 61/81  lung]
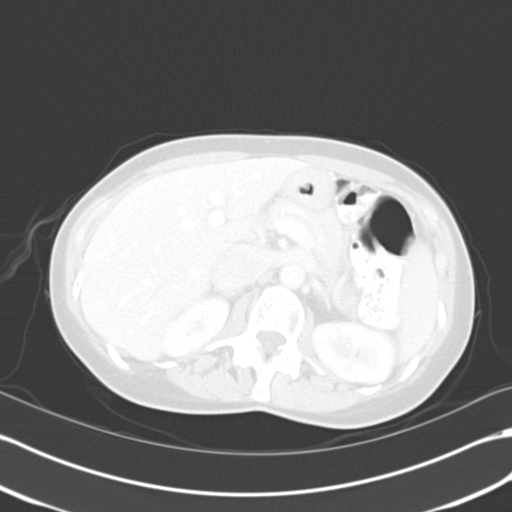
[im 71/81  soft-tissue]
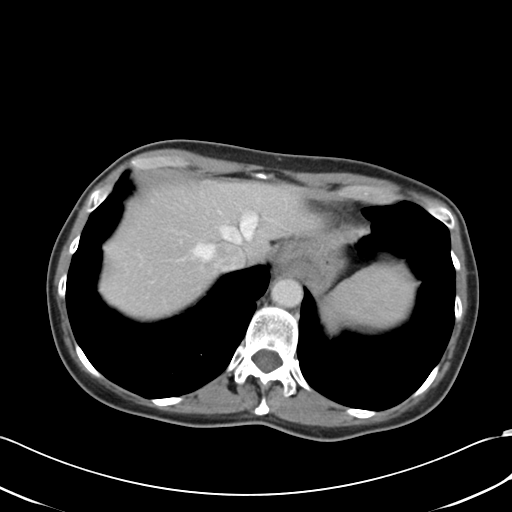
[im 71/81  lung]
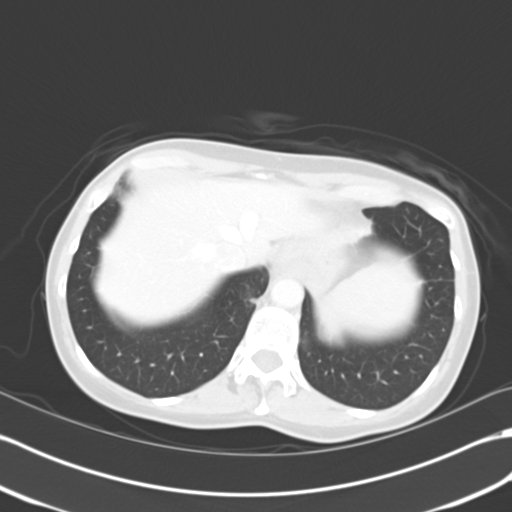

[Series 602: cor art · coronal · 0.68mm/px · 3 of 89 slices shown, 4 images]
[im 23/89  soft-tissue]
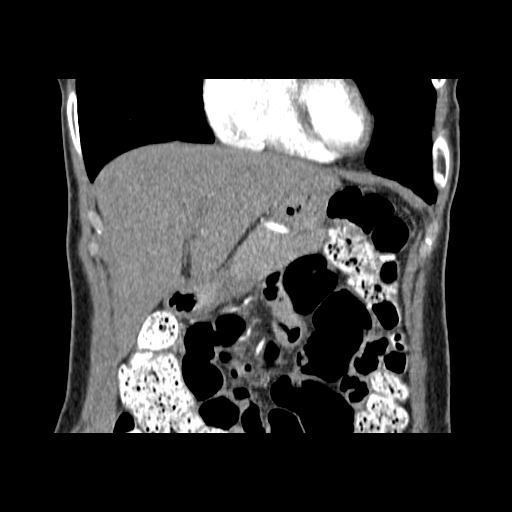
[im 45/89  soft-tissue]
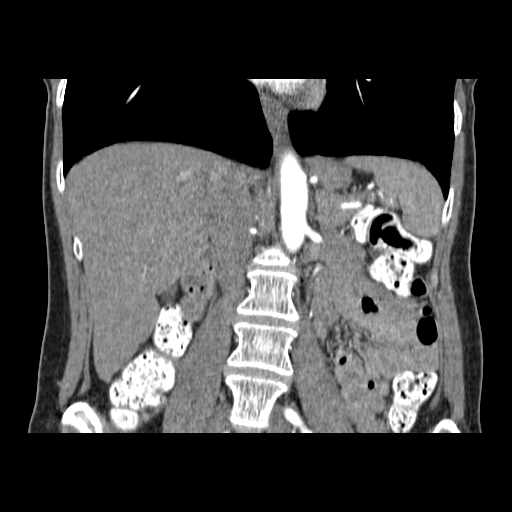
[im 45/89  bone]
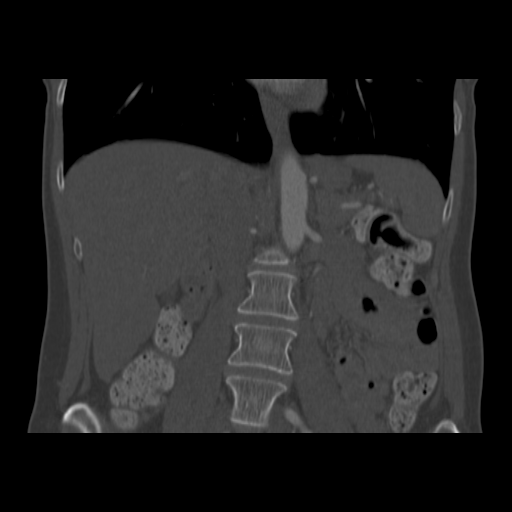
[im 67/89  soft-tissue]
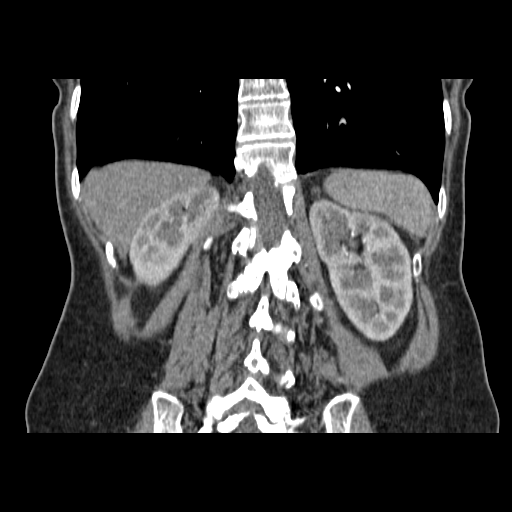

[12 of 46 positions shown; findings below may reference images not displayed]

FINDINGS: A lesion of the right adrenal gland measuring fluid
attenuation and showing no contrast enhancement is seen, which also
shows several focal peripheral calcifications.  This measures 2.1 x
2.8 cm and corresponds with the cystic mass seen on recent
ultrasound.  This is consistent with benign adrenal cyst most
likely related to old hemorrhage or previous infection. The left
adrenal gland is normal in appearance.

There is a 8 mm low attenuation lesion in the posterior right
hepatic lobe which appears to show some internal enhancement on
delayed images.  Although this is too small to definitively
characterize by CT alone, it does correlate with the hyperechoic
liver lesion seen on ultrasound, and together these are consistent
with a benign hemangioma.  No other liver lesions are identified.

The gallbladder, pancreas and spleen are normal in appearance.
Tiny sub-centimeter cysts noted in the upper pole of the right
kidney, however there is no evidence of renal mass or
hydronephrosis.

Retroverted uterus is seen with 2.5 cm fibroid in the fundus.  No
lymphadenopathy within the abdomen or pelvis.  Adnexa are
unremarkable.

There is no evidence of inflammatory process or abnormal fluid
collections within the abdomen or pelvis.  No evidence of dilated
bowel loops.
IMPRESSION: 1.  2.8 cm benign right adrenal cyst, and sub centimeter benign
right hepatic lobe hemangioma.
2.  2.5 cm uterine fibroid.
3.  No acute findings.

## 2013-01-27 ENCOUNTER — Encounter: Payer: Self-pay | Admitting: Gynecology

## 2013-01-27 ENCOUNTER — Ambulatory Visit (INDEPENDENT_AMBULATORY_CARE_PROVIDER_SITE_OTHER): Payer: Managed Care, Other (non HMO) | Admitting: Gynecology

## 2013-01-27 DIAGNOSIS — N904 Leukoplakia of vulva: Secondary | ICD-10-CM | POA: Insufficient documentation

## 2013-01-27 DIAGNOSIS — L940 Localized scleroderma [morphea]: Secondary | ICD-10-CM

## 2013-01-27 NOTE — Progress Notes (Signed)
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 Patient was seen in the office on May 19 for followup after having initiated treatment for periclitoral lichen sclerosis area. Patient has been responding to clobetasol and was to followup in 6 months. Patient is asymptomatic and doing well.  In April this year after colposcopic evaluation and biopsy pathology report demonstrated the following:  Pathology report demonstrated the following:  Vulva, biopsy, peri urethral  - BENIGN HYPERKERATOTIC SQUAMOUS MUCOSA, SEE COMMENT.  - NO DYSPLASIA OR MALIGNANCY IDENTIFIED.  Microscopic Comment  There are focal dermal changes suggestive of lichen sclerosus et atrophicus although traumatic irritation is  also a possibility. Please correlate with clinical impression. There is no dysplasia or malignancy identified  Exam today. Peri-clitoral area irritation from lichen sclerosus has diminished by 90%. I'm going to recommend the patient to continue to apply the clobetasol twice a week. She will continue her vaginal estrogen twice weekly she's been doing as well. She is otherwise scheduled to return back next year for her annual exam will reassess.

## 2013-01-28 ENCOUNTER — Other Ambulatory Visit: Payer: Self-pay

## 2013-03-25 DIAGNOSIS — M8580 Other specified disorders of bone density and structure, unspecified site: Secondary | ICD-10-CM

## 2013-03-25 HISTORY — DX: Other specified disorders of bone density and structure, unspecified site: M85.80

## 2013-07-02 ENCOUNTER — Ambulatory Visit (INDEPENDENT_AMBULATORY_CARE_PROVIDER_SITE_OTHER): Payer: Managed Care, Other (non HMO) | Admitting: Gynecology

## 2013-07-02 ENCOUNTER — Encounter: Payer: Self-pay | Admitting: Gynecology

## 2013-07-02 VITALS — BP 116/70 | Ht 66.0 in | Wt 120.0 lb

## 2013-07-02 DIAGNOSIS — M8580 Other specified disorders of bone density and structure, unspecified site: Secondary | ICD-10-CM

## 2013-07-02 DIAGNOSIS — M899 Disorder of bone, unspecified: Secondary | ICD-10-CM

## 2013-07-02 DIAGNOSIS — Z1159 Encounter for screening for other viral diseases: Secondary | ICD-10-CM

## 2013-07-02 DIAGNOSIS — M949 Disorder of cartilage, unspecified: Secondary | ICD-10-CM

## 2013-07-02 DIAGNOSIS — E039 Hypothyroidism, unspecified: Secondary | ICD-10-CM | POA: Insufficient documentation

## 2013-07-02 DIAGNOSIS — N952 Postmenopausal atrophic vaginitis: Secondary | ICD-10-CM

## 2013-07-02 DIAGNOSIS — Z01419 Encounter for gynecological examination (general) (routine) without abnormal findings: Secondary | ICD-10-CM

## 2013-07-02 LAB — CBC WITH DIFFERENTIAL/PLATELET
Basophils Absolute: 0 10*3/uL (ref 0.0–0.1)
Basophils Relative: 1 % (ref 0–1)
EOS ABS: 0.2 10*3/uL (ref 0.0–0.7)
Eosinophils Relative: 5 % (ref 0–5)
HEMATOCRIT: 44.8 % (ref 36.0–46.0)
HEMOGLOBIN: 15.6 g/dL — AB (ref 12.0–15.0)
LYMPHS ABS: 2 10*3/uL (ref 0.7–4.0)
Lymphocytes Relative: 52 % — ABNORMAL HIGH (ref 12–46)
MCH: 32.5 pg (ref 26.0–34.0)
MCHC: 34.8 g/dL (ref 30.0–36.0)
MCV: 93.3 fL (ref 78.0–100.0)
MONO ABS: 0.4 10*3/uL (ref 0.1–1.0)
MONOS PCT: 10 % (ref 3–12)
NEUTROS PCT: 32 % — AB (ref 43–77)
Neutro Abs: 1.2 10*3/uL — ABNORMAL LOW (ref 1.7–7.7)
Platelets: 206 10*3/uL (ref 150–400)
RBC: 4.8 MIL/uL (ref 3.87–5.11)
RDW: 13.2 % (ref 11.5–15.5)
WBC: 3.8 10*3/uL — ABNORMAL LOW (ref 4.0–10.5)

## 2013-07-02 LAB — COMPREHENSIVE METABOLIC PANEL
ALBUMIN: 4.2 g/dL (ref 3.5–5.2)
ALT: 18 U/L (ref 0–35)
AST: 24 U/L (ref 0–37)
Alkaline Phosphatase: 45 U/L (ref 39–117)
BILIRUBIN TOTAL: 0.9 mg/dL (ref 0.2–1.2)
BUN: 10 mg/dL (ref 6–23)
CO2: 27 meq/L (ref 19–32)
Calcium: 9.7 mg/dL (ref 8.4–10.5)
Chloride: 105 mEq/L (ref 96–112)
Creat: 0.87 mg/dL (ref 0.50–1.10)
GLUCOSE: 86 mg/dL (ref 70–99)
POTASSIUM: 4.4 meq/L (ref 3.5–5.3)
SODIUM: 140 meq/L (ref 135–145)
TOTAL PROTEIN: 6.9 g/dL (ref 6.0–8.3)

## 2013-07-02 LAB — LIPID PANEL
Cholesterol: 191 mg/dL (ref 0–200)
HDL: 92 mg/dL (ref >39)
LDL CALC: 89 mg/dL (ref 0–99)
Total CHOL/HDL Ratio: 2.1 Ratio
Triglycerides: 50 mg/dL (ref <150)
VLDL: 10 mg/dL (ref 0–40)

## 2013-07-02 NOTE — Progress Notes (Signed)
Kathleen Arroyo 21-Jan-1956 [id number]   History:    58 y.o.  for annual gyn exam with no complaints today. Patient has been doing well with clobetasol that she applies periclitoral he once a week for previously diagnosed lichen sclerosis. 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 Review of patient's records indicated 2013 she had a bone density study with her lowest T score at the left hip with a value of -1.2 with significant decrease in bone mineral density from previous study but had normal Frax analysis. Patient is taking calcium vitamin D and has an active lifestyle. Patient with no prior history of abnormal Pap smears. Patient with negative colonoscopy in 2013. Patient states that in 2013 her PCP had administer her Tdap vaccine. Dr. Bubba Camp has been treating her for her hypothyroidism.  Patient's mother had history of breast cancer. Patient's mammogram was done today and she does her monthly self breast examination. Patient has had history in the past of ovarian cyst. Patient's father has had history of colon cancer so she is having colonoscopies every 5 years   Past medical history,surgical history, family history and social history were all reviewed and documented in the EPIC chart.  Gynecologic History No LMP recorded. Patient is postmenopausal. Contraception: post menopausal status Last Pap: 2013. Results were: normal Last mammogram: Today. Results were: Results pending  Obstetric History OB History  Gravida Para Term Preterm AB SAB TAB Ectopic Multiple Living  3 1 1  2     1     # Outcome Date GA Lbr Len/2nd Weight Sex Delivery Anes PTL Lv  3 ABT           2 ABT           1 TRM                ROS: A ROS was performed and pertinent positives and negatives are included in the history.  GENERAL: No fevers or chills. HEENT: No change in vision, no earache, sore throat or sinus congestion. NECK: No pain or  stiffness. CARDIOVASCULAR: No chest pain or pressure. No palpitations. PULMONARY: No shortness of breath, cough or wheeze. GASTROINTESTINAL: No abdominal pain, nausea, vomiting or diarrhea, melena or bright red blood per rectum. GENITOURINARY: No urinary frequency, urgency, hesitancy or dysuria. MUSCULOSKELETAL: No joint or muscle pain, no back pain, no recent trauma. DERMATOLOGIC: No rash, no itching, no lesions. ENDOCRINE: No polyuria, polydipsia, no heat or cold intolerance. No recent change in weight. HEMATOLOGICAL: No anemia or easy bruising or bleeding. NEUROLOGIC: No headache, seizures, numbness, tingling or weakness. PSYCHIATRIC: No depression, no loss of interest in normal activity or change in sleep pattern.     Exam: chaperone present  BP 116/70  Ht 5\' 6"  (1.676 m)  Wt 120 lb (54.432 kg)  BMI 19.38 kg/m2  Body mass index is 19.38 kg/(m^2).  General appearance : Well developed well nourished female. No acute distress HEENT: Neck supple, trachea midline, no carotid bruits, no thyroidmegaly Lungs: Clear to auscultation, no rhonchi or wheezes, or rib retractions  Heart: Regular rate and rhythm, no murmurs or gallops Breast:Examined in sitting and supine position were symmetrical in appearance, no palpable masses or tenderness,  no skin retraction, no nipple inversion, no nipple discharge, no skin discoloration, no axillary or supraclavicular lymphadenopathy Abdomen: no palpable masses or tenderness, no rebound or guarding Extremities: no edema or skin discoloration or tenderness  Pelvic:  Bartholin,  Urethra, Skene Glands: Within normal limits             Vagina: No gross lesions or discharge, atrophic changes  Cervix: No gross lesions or discharge  Uterus  anteverted, normal size, shape and consistency, non-tender and mobile  Adnexa  Without masses or tenderness  Anus and perineum  normal   Rectovaginal  normal sphincter tone without palpated masses or tenderness              Hemoccult cards provided    Assessment/Plan:  58 y.o. female for annual exam was provided with fecal Hemoccult card to submit to the office when she comes in for her bone density study in May. The following labs were ordered: CBC, comprehensive metabolic panel, fasting lipid profile, TSH, as well as urinalysis. Her Pap smear was not done today in accordance to the new guidelines. Patient scheduled to follow up with endocrinologist in July and she will present these labs to take to her. She was reminded on the importance of calcium and vitamin D in regular exercise for osteoporosis prevention. Patient will need her shingles vaccine at age 30 next year.  Note: This dictation was prepared with  Dragon/digital dictation along withSmart phrase technology. Any transcriptional errors that result from this process are unintentional.   Terrance Mass MD, 10:09 AM 07/02/2013

## 2013-07-03 LAB — URINALYSIS W MICROSCOPIC + REFLEX CULTURE
Bacteria, UA: NONE SEEN
Bilirubin Urine: NEGATIVE
CASTS: NONE SEEN
Crystals: NONE SEEN
Glucose, UA: NEGATIVE mg/dL
HGB URINE DIPSTICK: NEGATIVE
LEUKOCYTES UA: NEGATIVE
NITRITE: NEGATIVE
PH: 7.5 (ref 5.0–8.0)
Protein, ur: NEGATIVE mg/dL
SPECIFIC GRAVITY, URINE: 1.01 (ref 1.005–1.030)
UROBILINOGEN UA: 0.2 mg/dL (ref 0.0–1.0)

## 2013-07-03 LAB — TSH: TSH: 1.31 u[IU]/mL (ref 0.350–4.500)

## 2013-07-03 LAB — HEPATITIS C ANTIBODY: HCV AB: NEGATIVE

## 2013-07-05 ENCOUNTER — Encounter: Payer: Self-pay | Admitting: Gynecology

## 2013-07-07 ENCOUNTER — Other Ambulatory Visit: Payer: Self-pay | Admitting: Gynecology

## 2013-07-07 DIAGNOSIS — D72819 Decreased white blood cell count, unspecified: Secondary | ICD-10-CM

## 2013-07-21 ENCOUNTER — Telehealth: Payer: Self-pay | Admitting: Internal Medicine

## 2013-07-21 NOTE — Telephone Encounter (Signed)
Left message for patient to call back  

## 2013-07-22 NOTE — Telephone Encounter (Signed)
Patient with one isolated episode of bright red rectal bleeding and some lower abdominal cramping with a BM yesterday am.  She has not had any additional bleeding since yesterday am.  She has no pain or other complaints today.  She is offered an appt with a APP for an exam, but she declines.  She wants to call back if bleeding returns and at that point will be seen with Dr. Olevia Perches or APP.  Her last colonoscopy was normal in 06/2010 and recall is in for 06/2015.

## 2013-07-22 NOTE — Telephone Encounter (Signed)
Reviewed and agree , I left a message on pt's mobile to call me if bleeding continues.

## 2013-08-25 ENCOUNTER — Other Ambulatory Visit: Payer: Self-pay | Admitting: Obstetrics and Gynecology

## 2013-09-21 ENCOUNTER — Ambulatory Visit (INDEPENDENT_AMBULATORY_CARE_PROVIDER_SITE_OTHER): Payer: Managed Care, Other (non HMO)

## 2013-09-21 ENCOUNTER — Other Ambulatory Visit: Payer: Managed Care, Other (non HMO)

## 2013-09-21 DIAGNOSIS — M899 Disorder of bone, unspecified: Secondary | ICD-10-CM

## 2013-09-21 DIAGNOSIS — M8580 Other specified disorders of bone density and structure, unspecified site: Secondary | ICD-10-CM

## 2013-09-21 DIAGNOSIS — M949 Disorder of cartilage, unspecified: Secondary | ICD-10-CM

## 2013-09-21 DIAGNOSIS — D72819 Decreased white blood cell count, unspecified: Secondary | ICD-10-CM

## 2013-09-21 LAB — CBC WITH DIFFERENTIAL/PLATELET
Basophils Absolute: 0 10*3/uL (ref 0.0–0.1)
Basophils Relative: 0 % (ref 0–1)
EOS ABS: 0.2 10*3/uL (ref 0.0–0.7)
EOS PCT: 5 % (ref 0–5)
HEMATOCRIT: 40.7 % (ref 36.0–46.0)
HEMOGLOBIN: 14.2 g/dL (ref 12.0–15.0)
Lymphocytes Relative: 37 % (ref 12–46)
Lymphs Abs: 1.6 10*3/uL (ref 0.7–4.0)
MCH: 32.6 pg (ref 26.0–34.0)
MCHC: 34.9 g/dL (ref 30.0–36.0)
MCV: 93.6 fL (ref 78.0–100.0)
MONOS PCT: 12 % (ref 3–12)
Monocytes Absolute: 0.5 10*3/uL (ref 0.1–1.0)
Neutro Abs: 2 10*3/uL (ref 1.7–7.7)
Neutrophils Relative %: 46 % (ref 43–77)
Platelets: 192 10*3/uL (ref 150–400)
RBC: 4.35 MIL/uL (ref 3.87–5.11)
RDW: 13.5 % (ref 11.5–15.5)
WBC: 4.4 10*3/uL (ref 4.0–10.5)

## 2013-09-28 ENCOUNTER — Other Ambulatory Visit: Payer: Managed Care, Other (non HMO) | Admitting: Anesthesiology

## 2013-09-28 DIAGNOSIS — Z1211 Encounter for screening for malignant neoplasm of colon: Secondary | ICD-10-CM

## 2014-01-24 ENCOUNTER — Encounter: Payer: Self-pay | Admitting: Gynecology

## 2014-07-14 ENCOUNTER — Ambulatory Visit (INDEPENDENT_AMBULATORY_CARE_PROVIDER_SITE_OTHER): Payer: Managed Care, Other (non HMO) | Admitting: Gynecology

## 2014-07-14 ENCOUNTER — Encounter: Payer: Self-pay | Admitting: Gynecology

## 2014-07-14 ENCOUNTER — Other Ambulatory Visit (HOSPITAL_COMMUNITY)
Admission: RE | Admit: 2014-07-14 | Discharge: 2014-07-14 | Disposition: A | Discharge status: Home / Self Care | Payer: Managed Care, Other (non HMO) | Source: Ambulatory Visit | Attending: Gynecology | Admitting: Gynecology

## 2014-07-14 VITALS — BP 120/70 | Ht 66.0 in | Wt 125.0 lb

## 2014-07-14 DIAGNOSIS — N952 Postmenopausal atrophic vaginitis: Secondary | ICD-10-CM | POA: Diagnosis not present

## 2014-07-14 DIAGNOSIS — Z01419 Encounter for gynecological examination (general) (routine) without abnormal findings: Secondary | ICD-10-CM | POA: Diagnosis not present

## 2014-07-14 LAB — CBC WITH DIFFERENTIAL/PLATELET
BASOS PCT: 0 % (ref 0–1)
Basophils Absolute: 0 10*3/uL (ref 0.0–0.1)
Eosinophils Absolute: 0.1 10*3/uL (ref 0.0–0.7)
Eosinophils Relative: 4 % (ref 0–5)
HEMATOCRIT: 41.4 % (ref 36.0–46.0)
HEMOGLOBIN: 14.1 g/dL (ref 12.0–15.0)
LYMPHS PCT: 47 % — AB (ref 12–46)
Lymphs Abs: 1.6 10*3/uL (ref 0.7–4.0)
MCH: 32.6 pg (ref 26.0–34.0)
MCHC: 34.1 g/dL (ref 30.0–36.0)
MCV: 95.6 fL (ref 78.0–100.0)
MONO ABS: 0.4 10*3/uL (ref 0.1–1.0)
MPV: 10 fL (ref 8.6–12.4)
Monocytes Relative: 12 % (ref 3–12)
NEUTROS ABS: 1.2 10*3/uL — AB (ref 1.7–7.7)
Neutrophils Relative %: 37 % — ABNORMAL LOW (ref 43–77)
PLATELETS: 191 10*3/uL (ref 150–400)
RBC: 4.33 MIL/uL (ref 3.87–5.11)
RDW: 13.4 % (ref 11.5–15.5)
WBC: 3.3 10*3/uL — AB (ref 4.0–10.5)

## 2014-07-14 LAB — COMPREHENSIVE METABOLIC PANEL
ALBUMIN: 3.9 g/dL (ref 3.5–5.2)
ALT: 15 U/L (ref 0–35)
AST: 20 U/L (ref 0–37)
Alkaline Phosphatase: 42 U/L (ref 39–117)
BUN: 9 mg/dL (ref 6–23)
CO2: 27 mEq/L (ref 19–32)
Calcium: 9.2 mg/dL (ref 8.4–10.5)
Chloride: 107 mEq/L (ref 96–112)
Creat: 0.9 mg/dL (ref 0.50–1.10)
Glucose, Bld: 82 mg/dL (ref 70–99)
POTASSIUM: 5.3 meq/L (ref 3.5–5.3)
SODIUM: 141 meq/L (ref 135–145)
Total Bilirubin: 0.7 mg/dL (ref 0.2–1.2)
Total Protein: 6.3 g/dL (ref 6.0–8.3)

## 2014-07-14 LAB — LIPID PANEL
CHOLESTEROL: 188 mg/dL (ref 0–200)
HDL: 103 mg/dL (ref >=46)
LDL CALC: 77 mg/dL (ref 0–99)
TRIGLYCERIDES: 38 mg/dL (ref <150)
Total CHOL/HDL Ratio: 1.8 Ratio
VLDL: 8 mg/dL (ref 0–40)

## 2014-07-14 LAB — TSH: TSH: 2.42 u[IU]/mL (ref 0.350–4.500)

## 2014-07-14 NOTE — Addendum Note (Signed)
Addended by: Nelva Nay on: 07/14/2014 10:16 AM   Modules accepted: Orders

## 2014-07-14 NOTE — Patient Instructions (Signed)
Increase the vaginal estrogen cream to 3 times weekly. Call if this continues to be an issue.  You may obtain a copy of any labs that were done today by logging onto MyChart as outlined in the instructions provided with your AVS (after visit summary). The office will not call with normal lab results but certainly if there are any significant abnormalities then we will contact you.   Health Maintenance, Female A healthy lifestyle and preventative care can promote health and wellness.  Maintain regular health, dental, and eye exams.  Eat a healthy diet. Foods like vegetables, fruits, whole grains, low-fat dairy products, and lean protein foods contain the nutrients you need without too many calories. Decrease your intake of foods high in solid fats, added sugars, and salt. Get information about a proper diet from your caregiver, if necessary.  Regular physical exercise is one of the most important things you can do for your health. Most adults should get at least 150 minutes of moderate-intensity exercise (any activity that increases your heart rate and causes you to sweat) each week. In addition, most adults need muscle-strengthening exercises on 2 or more days a week.   Maintain a healthy weight. The body mass index (BMI) is a screening tool to identify possible weight problems. It provides an estimate of body fat based on height and weight. Your caregiver can help determine your BMI, and can help you achieve or maintain a healthy weight. For adults 20 years and older:  A BMI below 18.5 is considered underweight.  A BMI of 18.5 to 24.9 is normal.  A BMI of 25 to 29.9 is considered overweight.  A BMI of 30 and above is considered obese.  Maintain normal blood lipids and cholesterol by exercising and minimizing your intake of saturated fat. Eat a balanced diet with plenty of fruits and vegetables. Blood tests for lipids and cholesterol should begin at age 6 and be repeated every 5 years. If  your lipid or cholesterol levels are high, you are over 50, or you are a high risk for heart disease, you may need your cholesterol levels checked more frequently.Ongoing high lipid and cholesterol levels should be treated with medicines if diet and exercise are not effective.  If you smoke, find out from your caregiver how to quit. If you do not use tobacco, do not start.  Lung cancer screening is recommended for adults aged 2 80 years who are at high risk for developing lung cancer because of a history of smoking. Yearly low-dose computed tomography (CT) is recommended for people who have at least a 30-pack-year history of smoking and are a current smoker or have quit within the past 15 years. A pack year of smoking is smoking an average of 1 pack of cigarettes a day for 1 year (for example: 1 pack a day for 30 years or 2 packs a day for 15 years). Yearly screening should continue until the smoker has stopped smoking for at least 15 years. Yearly screening should also be stopped for people who develop a health problem that would prevent them from having lung cancer treatment.  If you are pregnant, do not drink alcohol. If you are breastfeeding, be very cautious about drinking alcohol. If you are not pregnant and choose to drink alcohol, do not exceed 1 drink per day. One drink is considered to be 12 ounces (355 mL) of beer, 5 ounces (148 mL) of wine, or 1.5 ounces (44 mL) of liquor.  Avoid use of street  drugs. Do not share needles with anyone. Ask for help if you need support or instructions about stopping the use of drugs.  High blood pressure causes heart disease and increases the risk of stroke. Blood pressure should be checked at least every 1 to 2 years. Ongoing high blood pressure should be treated with medicines, if weight loss and exercise are not effective.  If you are 73 to 60 years old, ask your caregiver if you should take aspirin to prevent strokes.  Diabetes screening involves taking  a blood sample to check your fasting blood sugar level. This should be done once every 3 years, after age 70, if you are within normal weight and without risk factors for diabetes. Testing should be considered at a younger age or be carried out more frequently if you are overweight and have at least 1 risk factor for diabetes.  Breast cancer screening is essential preventative care for women. You should practice "breast self-awareness." This means understanding the normal appearance and feel of your breasts and may include breast self-examination. Any changes detected, no matter how small, should be reported to a caregiver. Women in their 2s and 30s should have a clinical breast exam (CBE) by a caregiver as part of a regular health exam every 1 to 3 years. After age 65, women should have a CBE every year. Starting at age 59, women should consider having a mammogram (breast X-ray) every year. Women who have a family history of breast cancer should talk to their caregiver about genetic screening. Women at a high risk of breast cancer should talk to their caregiver about having an MRI and a mammogram every year.  Breast cancer gene (BRCA)-related cancer risk assessment is recommended for women who have family members with BRCA-related cancers. BRCA-related cancers include breast, ovarian, tubal, and peritoneal cancers. Having family members with these cancers may be associated with an increased risk for harmful changes (mutations) in the breast cancer genes BRCA1 and BRCA2. Results of the assessment will determine the need for genetic counseling and BRCA1 and BRCA2 testing.  The Pap test is a screening test for cervical cancer. Women should have a Pap test starting at age 36. Between ages 16 and 54, Pap tests should be repeated every 2 years. Beginning at age 41, you should have a Pap test every 3 years as long as the past 3 Pap tests have been normal. If you had a hysterectomy for a problem that was not cancer  or a condition that could lead to cancer, then you no longer need Pap tests. If you are between ages 16 and 50, and you have had normal Pap tests going back 10 years, you no longer need Pap tests. If you have had past treatment for cervical cancer or a condition that could lead to cancer, you need Pap tests and screening for cancer for at least 20 years after your treatment. If Pap tests have been discontinued, risk factors (such as a new sexual partner) need to be reassessed to determine if screening should be resumed. Some women have medical problems that increase the chance of getting cervical cancer. In these cases, your caregiver may recommend more frequent screening and Pap tests.  The human papillomavirus (HPV) test is an additional test that may be used for cervical cancer screening. The HPV test looks for the virus that can cause the cell changes on the cervix. The cells collected during the Pap test can be tested for HPV. The HPV test could be  used to screen women aged 58 years and older, and should be used in women of any age who have unclear Pap test results. After the age of 73, women should have HPV testing at the same frequency as a Pap test.  Colorectal cancer can be detected and often prevented. Most routine colorectal cancer screening begins at the age of 67 and continues through age 43. However, your caregiver may recommend screening at an earlier age if you have risk factors for colon cancer. On a yearly basis, your caregiver may provide home test kits to check for hidden blood in the stool. Use of a small camera at the end of a tube, to directly examine the colon (sigmoidoscopy or colonoscopy), can detect the earliest forms of colorectal cancer. Talk to your caregiver about this at age 53, when routine screening begins. Direct examination of the colon should be repeated every 5 to 10 years through age 90, unless early forms of pre-cancerous polyps or small growths are found.  Hepatitis C  blood testing is recommended for all people born from [birth year] through 1966 and any individual with known risks for hepatitis C.  Practice safe sex. Use condoms and avoid high-risk sexual practices to reduce the spread of sexually transmitted infections (STIs). Sexually active women aged 29 and younger should be checked for Chlamydia, which is a common sexually transmitted infection. Older women with new or multiple partners should also be tested for Chlamydia. Testing for other STIs is recommended if you are sexually active and at increased risk.  Osteoporosis is a disease in which the bones lose minerals and strength with aging. This can result in serious bone fractures. The risk of osteoporosis can be identified using a bone density scan. Women ages 17 and over and women at risk for fractures or osteoporosis should discuss screening with their caregivers. Ask your caregiver whether you should be taking a calcium supplement or vitamin D to reduce the rate of osteoporosis.  Menopause can be associated with physical symptoms and risks. Hormone replacement therapy is available to decrease symptoms and risks. You should talk to your caregiver about whether hormone replacement therapy is right for you.  Use sunscreen. Apply sunscreen liberally and repeatedly throughout the day. You should seek shade when your shadow is shorter than you. Protect yourself by wearing long sleeves, pants, a wide-brimmed hat, and sunglasses year round, whenever you are outdoors.  Notify your caregiver of new moles or changes in moles, especially if there is a change in shape or color. Also notify your caregiver if a mole is larger than the size of a pencil eraser.  Stay current with your immunizations. Document Released: 09/24/2010 Document Revised: 07/06/2012 Document Reviewed: 09/24/2010 [facility] Patient Information 2014 [place].

## 2014-07-14 NOTE — Progress Notes (Signed)
Kathleen Arroyo [birth year] [id number]        59 y.o.  [id number] for annual exam.  First time that I am seeing this patient. Several issues noted below.  Past medical history,surgical history, problem list, medications, allergies, family history and social history were all reviewed and documented as reviewed in the EPIC chart.  ROS:  Performed with pertinent positives and negatives included in the history, assessment and plan.   Additional significant findings :  none   Exam: Kim Counsellor Vitals:   07/14/14 0926  BP: 120/70  Height: 5\' 6"  (1.676 m)  Weight: 125 lb (56.7 kg)   General appearance:  Normal affect, orientation and appearance. Skin: Grossly normal HEENT: Without gross lesions.  No cervical or supraclavicular adenopathy. Thyroid normal.  Lungs:  Clear without wheezing, rales or rhonchi Cardiac: RR, without RMG Abdominal:  Soft, nontender, without masses, guarding, rebound, organomegaly or hernia Breasts:  Examined lying and sitting without masses, retractions, discharge or axillary adenopathy. Pelvic:  Ext/BUS/vagina with significant atrophic changes. No white changes to suggest lichen sclerosis  Cervix with atrophic changes. Pap smear done  Uterus retroverted, normal size, shape and contour, midline and mobile nontender   Adnexa  Without masses or tenderness    Anus and perineum  Normal   Rectovaginal  Normal sphincter tone without palpated masses or tenderness.    Assessment/Plan:  59 y.o. [id number] female for annual exam.   1. Postmenopausal/atrophic genital changes.  Patient using estradiol formulated vaginal cream twice weekly. Still is having a lot of discomfort with intercourse. Options for management reviewed to include systemic HRT, Osphena, vaginal estrogen. Risks of hormone replacement reviewed to include increased risk of stroke heart attack DVT and breast cancer. Issues of absorption with vaginal estrogen leading to these complications or endometrial  stimulation also discussed. After a lengthy discussion as to the pros/cons of each choice and noting that she is having no global menopausal symptoms such as hot flashes, night sweats we will increase her vaginal estradiol cream 3 times weekly and see if this does not help. We'll also try oil-based lubricant. She is no history of vaginal bleeding and will call if she does any vaginal bleeding. 2. Prior diagnosis of lichen sclerosus due to white skin changes in the periclitoral region. Transiently used clobetasol cream.  Patient states that she really was not having symptoms previously but was diagnosed on visual exam and biopsy that showed more hyperkeratoses. Exam today shows no evidence of white changes. We'll continue to observe but will not use clobetasol given lack of appearance and symptoms. 3. Osteopenia.  DEXA 2015 T score -1.5. Fraction 12%/0.4%  Stable from prior DEXA.  Check vitamin D level today. Plan repeat DEXA at 2-5 year interval. 4. Mammography today. Continue with annual mammography. SBE monthly reviewed. 5. Pap smear 2013. Pap smear done today. No history of significant abnormal Pap smears previously. 6. Coloscopy 2012. Repeat at their recommended interval. 7. Health maintenance. Patient does have a primary physician but asked if I would do her fasting lab work. CBC, comprehensive metabolic panel, lipid profile, urinalysis, TSH and vitamin D done. Follow up in one year, sooner as needed.    Anastasio Auerbach MD, 10:05 AM 07/14/2014

## 2014-07-15 LAB — URINALYSIS W MICROSCOPIC + REFLEX CULTURE
BILIRUBIN URINE: NEGATIVE
Bacteria, UA: NONE SEEN
Casts: NONE SEEN
Crystals: NONE SEEN
Glucose, UA: NEGATIVE mg/dL
Hgb urine dipstick: NEGATIVE
Nitrite: NEGATIVE
Protein, ur: NEGATIVE mg/dL
SPECIFIC GRAVITY, URINE: 1.011 (ref 1.005–1.030)
UROBILINOGEN UA: 0.2 mg/dL (ref 0.0–1.0)
pH: 7.5 (ref 5.0–8.0)

## 2014-07-15 LAB — VITAMIN D 25 HYDROXY (VIT D DEFICIENCY, FRACTURES): Vit D, 25-Hydroxy: 43 ng/mL (ref 30–100)

## 2014-07-15 LAB — CYTOLOGY - PAP

## 2014-07-16 LAB — URINE CULTURE
Colony Count: NO GROWTH
Organism ID, Bacteria: NO GROWTH

## 2014-07-21 ENCOUNTER — Other Ambulatory Visit: Payer: Self-pay

## 2014-07-21 MED ORDER — NONFORMULARY OR COMPOUNDED ITEM: Status: DC

## 2014-07-21 NOTE — Telephone Encounter (Signed)
Rx called in 

## 2014-09-05 ENCOUNTER — Telehealth: Payer: Self-pay | Admitting: *Deleted

## 2014-09-05 NOTE — Telephone Encounter (Signed)
Office visit

## 2014-09-05 NOTE — Telephone Encounter (Signed)
Will relay to claudia to schedule.

## 2014-09-05 NOTE — Telephone Encounter (Signed)
Patient is scheduled on tomorrow.

## 2014-09-05 NOTE — Telephone Encounter (Signed)
Pt noticed some brownish discharge yesterday that soiled her panties, no odor, no itching, said it smelled like vinegar, medium amount of discharge. Pt does not douche, has not noticed any discharge today, asked if she should watch for now? Please advise

## 2014-09-06 ENCOUNTER — Ambulatory Visit (INDEPENDENT_AMBULATORY_CARE_PROVIDER_SITE_OTHER): Payer: Managed Care, Other (non HMO) | Admitting: Women's Health

## 2014-09-06 ENCOUNTER — Encounter: Payer: Self-pay | Admitting: Women's Health

## 2014-09-06 DIAGNOSIS — N898 Other specified noninflammatory disorders of vagina: Secondary | ICD-10-CM

## 2014-09-06 DIAGNOSIS — B373 Candidiasis of vulva and vagina: Secondary | ICD-10-CM

## 2014-09-06 DIAGNOSIS — N9489 Other specified conditions associated with female genital organs and menstrual cycle: Secondary | ICD-10-CM | POA: Diagnosis not present

## 2014-09-06 DIAGNOSIS — B3731 Acute candidiasis of vulva and vagina: Secondary | ICD-10-CM

## 2014-09-06 LAB — WET PREP FOR TRICH, YEAST, CLUE
Clue Cells Wet Prep HPF POC: NONE SEEN
Trich, Wet Prep: NONE SEEN

## 2014-09-06 MED ORDER — FLUCONAZOLE 150 MG PO TABS: ORAL_TABLET | ORAL | Status: DC

## 2014-09-06 NOTE — Patient Instructions (Signed)

## 2014-09-06 NOTE — Progress Notes (Signed)
Patient ID: Kathleen Arroyo, female   DOB: March 19, 1956, 59 y.o.   MRN: [id number] Presents with complaint of vaginal discharge with brown tinge with burning and irritation for several days. Using Estrace vaginal cream 2-3 times weekly with some relief of dryness. Dyspareunia, reports difficulties and feels like he is hitting something due to dryness. Denies urinary symptoms, abdominal pain or fever.   Exam: Appears well. External genitalia erythematous at introitus, very tense, atrophic, vaginal walls mildly erythematous scant discharge no blood noted, wet prep positive for moderate yeast. Bimanual tense with exam, slight burning sensation.  Yeast vaginitis Vaginal atrophy and dyspareunia  Plan: Diflucan 150 by mouth today repeat in 3 days if needed. Continue Estrace vaginal cream, add Replens vaginal lubricant twice weekly, relaxation  encouraged. Call if no relief.

## 2014-09-28 ENCOUNTER — Telehealth: Payer: Self-pay | Admitting: *Deleted

## 2014-09-28 NOTE — Telephone Encounter (Signed)
Telephone call, states vaginal issues improved, will take last Diflucan. Yeast prevention discussed. Will call if continued problems.

## 2014-09-28 NOTE — Telephone Encounter (Signed)
Pt called to follow up from [place] on 09/06/14, pt said the same brown tinge with burning and irritation happened again on Saturday and Sunday this past weekend. Pt said now she feels fine, she has done as noted in OV with estrace cream and Replens. Please advise

## 2014-11-14 ENCOUNTER — Telehealth: Payer: Self-pay | Admitting: *Deleted

## 2014-11-14 NOTE — Telephone Encounter (Signed)
Pt called and left message in voicemail c/o brownish discharge, pt last seen in June. I left message on pt voicemail to schedule OV.

## 2014-11-15 ENCOUNTER — Ambulatory Visit (INDEPENDENT_AMBULATORY_CARE_PROVIDER_SITE_OTHER): Payer: Managed Care, Other (non HMO) | Admitting: Women's Health

## 2014-11-15 ENCOUNTER — Encounter: Payer: Self-pay | Admitting: Women's Health

## 2014-11-15 VITALS — BP 115/80 | Ht 66.0 in | Wt 122.0 lb

## 2014-11-15 DIAGNOSIS — R350 Frequency of micturition: Secondary | ICD-10-CM | POA: Diagnosis not present

## 2014-11-15 DIAGNOSIS — N898 Other specified noninflammatory disorders of vagina: Secondary | ICD-10-CM | POA: Diagnosis not present

## 2014-11-15 LAB — WET PREP FOR TRICH, YEAST, CLUE
CLUE CELLS WET PREP: NONE SEEN
TRICH WET PREP: NONE SEEN
WBC, Wet Prep HPF POC: NONE SEEN
Yeast Wet Prep HPF POC: NONE SEEN

## 2014-11-15 LAB — URINALYSIS W MICROSCOPIC + REFLEX CULTURE
Bilirubin Urine: NEGATIVE
Casts: NONE SEEN [LPF]
Crystals: NONE SEEN [HPF]
GLUCOSE, UA: NEGATIVE
HGB URINE DIPSTICK: NEGATIVE
Ketones, ur: NEGATIVE
NITRITE: NEGATIVE
PH: 7 (ref 5.0–8.0)
Protein, ur: NEGATIVE
YEAST: NONE SEEN [HPF]

## 2014-11-15 MED ORDER — FLUCONAZOLE 150 MG PO TABS: 150.0000 mg | ORAL_TABLET | Freq: Once | ORAL | Status: DC

## 2014-11-15 NOTE — Progress Notes (Signed)
Patient ID: Kathleen Arroyo, female   DOB: Aug 26, 1955, 59 y.o.   MRN: [id number] Presents with complaint of vaginal irritation with burning sensation  mostly vaginally. States noted some brown discharge without itching or odor 2 days ago, no bright red blood. Denies abdominal pain, fever. Occasional frequency without pain or burning with urination. Postmenopausal on Estrace vaginal cream twice weekly, which can cause some vaginal burning after application.  Exam: Appears well. External genitalia extremely erythematous at introitus, speculum exam scant clear discharge, minimal erythema, atrophic wet prep negative. UA: Trace leukocytes, 10-20 WBCs, any bacteria, 10-20 squamous epithelials  Possible yeast vaginitis Atrophic vaginitis  Plan: Diflucan 150 by mouth 1 dose. Yeast prevention discussed. Will decrease vaginal estrogen to once weekly to see if any improvement. Continue Replens with intercourse, urine culture pending. Call if continued problems.

## 2014-11-15 NOTE — Addendum Note (Signed)
Addended by: Burnett Kanaris on: 11/15/2014 03:46 PM   Modules accepted: Orders

## 2014-11-18 LAB — URINE CULTURE
Colony Count: NO GROWTH
Organism ID, Bacteria: NO GROWTH

## 2015-04-27 ENCOUNTER — Encounter: Payer: Self-pay | Admitting: Gastroenterology

## 2015-04-27 ENCOUNTER — Other Ambulatory Visit: Payer: Self-pay | Admitting: Women's Health

## 2015-04-27 ENCOUNTER — Ambulatory Visit: Payer: Managed Care, Other (non HMO) | Admitting: Gynecology

## 2015-04-27 ENCOUNTER — Encounter: Payer: Self-pay | Admitting: Women's Health

## 2015-04-27 ENCOUNTER — Ambulatory Visit (INDEPENDENT_AMBULATORY_CARE_PROVIDER_SITE_OTHER): Payer: Managed Care, Other (non HMO) | Admitting: Women's Health

## 2015-04-27 ENCOUNTER — Ambulatory Visit (INDEPENDENT_AMBULATORY_CARE_PROVIDER_SITE_OTHER): Payer: Managed Care, Other (non HMO)

## 2015-04-27 VITALS — BP 119/74

## 2015-04-27 DIAGNOSIS — N8000 Endometriosis of the uterus, unspecified: Secondary | ICD-10-CM

## 2015-04-27 DIAGNOSIS — R1032 Left lower quadrant pain: Secondary | ICD-10-CM | POA: Diagnosis not present

## 2015-04-27 DIAGNOSIS — D251 Intramural leiomyoma of uterus: Secondary | ICD-10-CM | POA: Diagnosis not present

## 2015-04-27 NOTE — Patient Instructions (Addendum)
Abdominal Pain, Adult °Many things can cause abdominal pain. Usually, abdominal pain is not caused by a disease and will improve without treatment. It can often be observed and treated at home. Your health care provider will do a physical exam and possibly order blood tests and X-rays to help determine the seriousness of your pain. However, in many cases, more time must pass before a clear cause of the pain can be found. Before that point, your health care provider may not know if you need more testing or further treatment. °HOME CARE INSTRUCTIONS °Monitor your abdominal pain for any changes. The following actions may help to alleviate any discomfort you are experiencing: °· Only take over-the-counter or prescription medicines as directed by your health care provider. °· Do not take laxatives unless directed to do so by your health care provider. °· Try a clear liquid diet (broth, tea, or water) as directed by your health care provider. Slowly move to a bland diet as tolerated. °SEEK MEDICAL CARE IF: °· You have unexplained abdominal pain. °· You have abdominal pain associated with nausea or diarrhea. °· You have pain when you urinate or have a bowel movement. °· You experience abdominal pain that wakes you in the night. °· You have abdominal pain that is worsened or improved by eating food. °· You have abdominal pain that is worsened with eating fatty foods. °· You have a fever. °SEEK IMMEDIATE MEDICAL CARE IF: °· Your pain does not go away within 2 hours. °· You keep throwing up (vomiting). °· Your pain is felt only in portions of the abdomen, such as the right side or the left lower portion of the abdomen. °· You pass bloody or black tarry stools. °MAKE SURE YOU: °· Understand these instructions. °· Will watch your condition. °· Will get help right away if you are not doing well or get worse. °  °This information is not intended to replace advice given to you by your health care provider. Make sure you discuss  any questions you have with your health care provider. °  °Document Released: 12/19/2004 Document Revised: 11/30/2014 Document Reviewed: 11/18/2012 °Elsevier Interactive Patient Education ©2016 [employer] ° °Constipation, Adult °Constipation is when a person has fewer than three bowel movements a week, has difficulty having a bowel movement, or has stools that are dry, hard, or larger than normal. As people grow older, constipation is more common. A low-fiber diet, not taking in enough fluids, and taking certain medicines may make constipation worse.  °CAUSES  °· Certain medicines, such as antidepressants, pain medicine, iron supplements, antacids, and water pills.   °· Certain diseases, such as diabetes, irritable bowel syndrome (IBS), thyroid disease, or depression.   °· Not drinking enough water.   °· Not eating enough fiber-rich foods.   °· Stress or travel.   °· Lack of physical activity or exercise.   °· Ignoring the urge to have a bowel movement.   °· Using laxatives too much.   °SIGNS AND SYMPTOMS  °· Having fewer than three bowel movements a week.   °· Straining to have a bowel movement.   °· Having stools that are hard, dry, or larger than normal.   °· Feeling full or bloated.   °· Pain in the lower abdomen.   °· Not feeling relief after having a bowel movement.   °DIAGNOSIS  °Your health care provider will take a medical history and perform a physical exam. Further testing may be done for severe constipation. Some tests may include: °· A barium enema X-ray to examine your rectum, colon, and, sometimes,   your small intestine.   °· A sigmoidoscopy to examine your lower colon.   °· A colonoscopy to examine your entire colon. °TREATMENT  °Treatment will depend on the severity of your constipation and what is causing it. Some dietary treatments include drinking more fluids and eating more fiber-rich foods. Lifestyle treatments may include regular exercise. If these diet and lifestyle recommendations do not  help, your health care provider may recommend taking over-the-counter laxative medicines to help you have bowel movements. Prescription medicines may be prescribed if over-the-counter medicines do not work.  °HOME CARE INSTRUCTIONS  °· Eat foods that have a lot of fiber, such as fruits, vegetables, whole grains, and beans. °· Limit foods high in fat and processed sugars, such as french fries, hamburgers, cookies, candies, and soda.   °· A fiber supplement may be added to your diet if you cannot get enough fiber from foods.   °· Drink enough fluids to keep your urine clear or pale yellow.   °· Exercise regularly or as directed by your health care provider.   °· Go to the restroom when you have the urge to go. Do not hold it.   °· Only take over-the-counter or prescription medicines as directed by your health care provider. Do not take other medicines for constipation without talking to your health care provider first.   °SEEK IMMEDIATE MEDICAL CARE IF:  °· You have bright red blood in your stool.   °· Your constipation lasts for more than 4 days or gets worse.   °· You have abdominal or rectal pain.   °· You have thin, pencil-like stools.   °· You have unexplained weight loss. °MAKE SURE YOU:  °· Understand these instructions. °· Will watch your condition. °· Will get help right away if you are not doing well or get worse. °  °This information is not intended to replace advice given to you by your health care provider. Make sure you discuss any questions you have with your health care provider. °  °Document Released: 12/08/2003 Document Revised: 04/01/2014 Document Reviewed: 12/21/2012 °Elsevier Interactive Patient Education ©2016 [employer] ° °

## 2015-04-27 NOTE — Progress Notes (Signed)
Presents with pain in left lower abdomen, has had off and on since last October . Rates 9/10 on pain scale, lasts about one hour and then goes away. Started on oral Estradiol/Progesterone (estradiol patch 0.0375 and Prometrium 100 mg daily) prescribed per integrative medicine 11/2014 prior to pain starting and stopped taking it in December with no change in pain. Denies constipation, pain or burning with urination, changes in elimination, dyspareunia, vaginal discharge, or fever. Reports pain free today and no changes in pain with bowel movements.  Exam:  No abdominal tenderness  noted with light or deep palpation in all four quadrants.  BS present in all quadrants.  Ultrasound:  T/V, retroverted uterus w/fundal fibroid 19 x 22 x 20 mm, endometrium displaced by fibroids, myometrium numerous cortical cystic areas consistent with adenomyosis. Right ovary normal. Left ovary normal. Fluid in cul-de-sac 23 x 16 mm, no apparent masses seen in the right or left adnexal. Endometrium 3.7 mm.  Right lower quadrant pain Adenomyosis, small fundal fibroid  Plan:  Schedule colonoscopy soon, due 11/2015, father history of colon cancer.  Continue to follow IBS diet, monitoring and avoiding foods that worsen bloating, constipation/diarrhea.

## 2015-06-20 ENCOUNTER — Encounter: Payer: Self-pay | Admitting: Gastroenterology

## 2015-06-20 ENCOUNTER — Ambulatory Visit: Payer: Managed Care, Other (non HMO) | Admitting: Gastroenterology

## 2015-06-20 ENCOUNTER — Ambulatory Visit (INDEPENDENT_AMBULATORY_CARE_PROVIDER_SITE_OTHER): Payer: Managed Care, Other (non HMO) | Admitting: Gastroenterology

## 2015-06-20 VITALS — BP 120/70 | HR 72 | Ht 65.5 in | Wt 118.2 lb

## 2015-06-20 DIAGNOSIS — R1032 Left lower quadrant pain: Secondary | ICD-10-CM | POA: Diagnosis not present

## 2015-06-20 DIAGNOSIS — K5900 Constipation, unspecified: Secondary | ICD-10-CM

## 2015-06-20 DIAGNOSIS — Z1211 Encounter for screening for malignant neoplasm of colon: Secondary | ICD-10-CM | POA: Diagnosis not present

## 2015-06-20 MED ORDER — NA SULFATE-K SULFATE-MG SULF 17.5-3.13-1.6 GM/177ML PO SOLN: 1.0000 | Freq: Once | ORAL | Status: DC

## 2015-06-20 NOTE — Progress Notes (Signed)
HPI :  60 y/o female with a history of IBS and GERD, former Dr. Olevia Perches patient, new to me. She has not been seen in several years.   Patient is here today complaining of LLQ pain, started in the fall 2016. She had a vaginal [place] at the time and it was normal. The pain has persisted over time but slightly improved. The discomfort can come and go. She reports it occurs daily for the most part - lasts an hour at a time. No radiation. It can sometimes occurs after a bowel movements, sometimes not related at all. She does not think associated with her bowels in general. She is having a bowel movement once per day when taking Herb-lax for her chronic constipation. She continues to have some hard stools and has a hard time eliminating stool from the rectum. No blood in the stools recently, she has had this remotely sometime within the past year or so but she has not seen this in months. No weight loss. Eating well, no vomiting. She can't think of any aggrevating or alleviating factors. She has applied topical creams which haven't helped. She will feel it usually once per day. It occurs intermittently, has not woken her from sleep. Her father had colon cancer, she thinks he was diagnosed around age 73, she is not sure exactly which age. She has been having colonoscopy every 5 years. She has not tried fiber supplements for constipation. She has not tried Miralax. She denies routine NSAID use. Last colonoscopy was in April 2012 which was normal.   Endoscopic history: EGD 12/13/10 - 1-2cm hiatal hernia, otherwise normal Colonoscopy 07/09/10 - normal exam   Past Medical History  Diagnosis Date  . Renal calculus   . IBS (irritable bowel syndrome)   . GERD (gastroesophageal reflux disease)   . Hiatal hernia   . Hemorrhoids   . Atrophic vaginitis   . Osteopenia 2015    T score -1.5 Stable from prior Dexa  . Hypothyroidism   . Endometriosis     based upon History.  Not surgically confirmed  . Uterine fibroid  2014     Ultrasound showed 20mm, 3mm     Past Surgical History  Procedure Laterality Date  . Colonoscopy    . Upper gastrointestinal endoscopy    . Cesarean section      x 1  . Bunionectomy      Right foot bunionectomy and hammer toes bilaterallly  . Dilation and curettage of uterus    . Foot surgery Bilateral     X 2   Family History  Problem Relation Age of Onset  . Breast cancer Mother     Late 55's  . Stroke Mother   . Hypertension Mother   . Colon polyps Father   . Diabetes Father   . Colon cancer Father   . Heart disease Father   . Hypertension Father   . Cancer Maternal Grandmother     Leukemia   Social History  Substance Use Topics  . Smoking status: Former Research scientist (life sciences)  . Smokeless tobacco: Never Used  . Alcohol Use: No   Current Outpatient Prescriptions  Medication Sig Dispense Refill  . AMBULATORY NON FORMULARY MEDICATION Medication Name: Petra Kuba Thyroid 1 grain daily    . AMBULATORY NON FORMULARY MEDICATION Medication Name: Omega Guard 2 capsules daily    . AMBULATORY NON FORMULARY MEDICATION Medication Name: OptiFlora once daily    . B Complex Vitamins (VITAMIN B COMPLEX PO) Take 1 tablet by  mouth daily.    Marland Kitchen BIOTIN PO Take 5,000 mcg by mouth every other day.     . Cholecalciferol (VITAMIN D3) 5000 units CAPS Take 1 capsule by mouth daily.    . NON FORMULARY Take 3 capsules by mouth daily. BONE SUPPORT    . NON FORMULARY Take 6 capsules by mouth daily. HERB-LAX    . NON FORMULARY 1 Dose daily. COLOSTRUM    . Selenium (SELENIMIN PO) Take 200 mcg by mouth daily.      No current facility-administered medications for this visit.   No Known Allergies   Review of Systems: All systems reviewed and negative except where noted in HPI.   Lab Results  Component Value Date   WBC 3.3* 07/14/2014   HGB 14.1 07/14/2014   HCT 41.4 07/14/2014   MCV 95.6 07/14/2014   PLT 191 07/14/2014    Lab Results  Component Value Date   CREATININE 0.90 07/14/2014   BUN 9  07/14/2014   NA 141 07/14/2014   K 5.3 07/14/2014   CL 107 07/14/2014   CO2 27 07/14/2014    Lab Results  Component Value Date   ALT 15 07/14/2014   AST 20 07/14/2014   ALKPHOS 42 07/14/2014   BILITOT 0.7 07/14/2014     Physical Exam: BP 120/70 mmHg  Pulse 72  Ht 5' 5.5" (1.664 m)  Wt 118 lb 3.2 oz (53.615 kg)  BMI 19.36 kg/m2 Constitutional: Pleasant,well-developed, female in no acute distress. HEENT: Normocephalic and atraumatic. Conjunctivae are normal. No scleral icterus. Neck supple.  Cardiovascular: Normal rate, regular rhythm.  Pulmonary/chest: Effort normal and breath sounds normal. No wheezing, rales or rhonchi. Abdominal: Soft, nondistended, nontender, negative Carnett sign. Bowel sounds active throughout. There are no masses palpable. No hepatomegaly. Extremities: no edema Lymphadenopathy: No cervical adenopathy noted. Neurological: Alert and oriented to person place and time. Skin: Skin is warm and dry. No rashes noted. Psychiatric: Normal mood and affect. Behavior is normal.   ASSESSMENT AND PLAN: 60 y/o female with several months of LLQ pain - appears to be in the inguinal area or anterior hip location at times, but she also has some discomfort more so in the LLQ. Unclear etiology. Prior pelvic US negative. She is due for a screening colonoscopy given her FH of colon cancer in her father at age 67, and we will coordinate this for her after a discussion of the risks / benefits. I offered her a CT scan in the interim but she prefers to await the result of the colonoscopy first. If the colonoscopy is negative, we will consider CT scan. Otherwise, recommend a trial of daily fiber supplement for her constipation and will see if this helps. She prefers to treat naturally and declined miralax at this time, but may consider it if fiber supplements do not provide benefit. She agreed and will await results of colonoscopy.   Muldrow Cellar, MD [facility]  Gastroenterology Pager [phone]

## 2015-06-20 NOTE — Patient Instructions (Signed)
You have been scheduled for a colonoscopy. Please follow written instructions given to you at your visit today.  Please pick up your prep supplies at the pharmacy within the next 1-3 days. If you use inhalers (even only as needed), please bring them with you on the day of your procedure. Your physician has requested that you go to www.startemmi.com and enter the access code given to you at your visit today. This web site gives a general overview about your procedure. However, you should still follow specific instructions given to you by our office regarding your preparation for the procedure.  Use Fiber Supplement daily

## 2015-07-13 ENCOUNTER — Encounter: Payer: Managed Care, Other (non HMO) | Admitting: Gastroenterology

## 2015-07-28 ENCOUNTER — Encounter: Payer: Self-pay | Admitting: Gastroenterology

## 2015-08-10 ENCOUNTER — Ambulatory Visit (AMBULATORY_SURGERY_CENTER): Payer: Managed Care, Other (non HMO) | Admitting: Gastroenterology

## 2015-08-10 ENCOUNTER — Encounter: Payer: Self-pay | Admitting: Gastroenterology

## 2015-08-10 VITALS — BP 122/65 | HR 71 | Temp 99.1°F | Resp 11 | Ht 65.0 in | Wt 118.0 lb

## 2015-08-10 DIAGNOSIS — K621 Rectal polyp: Secondary | ICD-10-CM

## 2015-08-10 DIAGNOSIS — Z800 Family history of malignant neoplasm of digestive organs: Secondary | ICD-10-CM

## 2015-08-10 DIAGNOSIS — D129 Benign neoplasm of anus and anal canal: Secondary | ICD-10-CM

## 2015-08-10 DIAGNOSIS — D120 Benign neoplasm of cecum: Secondary | ICD-10-CM

## 2015-08-10 DIAGNOSIS — Z1211 Encounter for screening for malignant neoplasm of colon: Secondary | ICD-10-CM | POA: Diagnosis not present

## 2015-08-10 DIAGNOSIS — D128 Benign neoplasm of rectum: Secondary | ICD-10-CM

## 2015-08-10 MED ORDER — SODIUM CHLORIDE 0.9 % IV SOLN: 500.0000 mL | INTRAVENOUS | Status: DC

## 2015-08-10 NOTE — Progress Notes (Signed)
Patient awakening,vss,report to rn 

## 2015-08-10 NOTE — Progress Notes (Signed)
Called to room to assist during endoscopic procedure.  Patient ID and intended procedure confirmed with present staff. Received instructions for my participation in the procedure from the performing physician.  

## 2015-08-10 NOTE — Patient Instructions (Signed)
Discharge instructions given. Handouts on polyps and hemorrhoids. Hold aspirin,ibuprofen,naproxen,or other non-steroidal anti-inflammatory drugs for 2 weeks. Resume previous medications. YOU HAD AN ENDOSCOPIC PROCEDURE TODAY AT [facility]:   Refer to the procedure report that was given to you for any specific questions about what was found during the examination.  If the procedure report does not answer your questions, please call your gastroenterologist to clarify.  If you requested that your care partner not be given the details of your procedure findings, then the procedure report has been included in a sealed envelope for you to review at your convenience later.  YOU SHOULD EXPECT: Some feelings of bloating in the abdomen. Passage of more gas than usual.  Walking can help get rid of the air that was put into your GI tract during the procedure and reduce the bloating. If you had a lower endoscopy (such as a colonoscopy or flexible sigmoidoscopy) you may notice spotting of blood in your stool or on the toilet paper. If you underwent a bowel prep for your procedure, you may not have a normal bowel movement for a few days.  Please Note:  You might notice some irritation and congestion in your nose or some drainage.  This is from the oxygen used during your procedure.  There is no need for concern and it should clear up in a day or so.  SYMPTOMS TO REPORT IMMEDIATELY:   Following lower endoscopy (colonoscopy or flexible sigmoidoscopy):  Excessive amounts of blood in the stool  Significant tenderness or worsening of abdominal pains  Swelling of the abdomen that is new, acute  Fever of 100F or higher   For urgent or emergent issues, a gastroenterologist can be reached at any hour by calling [phone].   DIET: Your first meal following the procedure should be a small meal and then it is ok to progress to your normal diet. Heavy or fried foods are harder to digest and may  make you feel nauseous or bloated.  Likewise, meals heavy in dairy and vegetables can increase bloating.  Drink plenty of fluids but you should avoid alcoholic beverages for 24 hours.  ACTIVITY:  You should plan to take it easy for the rest of today and you should NOT DRIVE or use heavy machinery until tomorrow (because of the sedation medicines used during the test).    FOLLOW UP: Our staff will call the number listed on your records the next business day following your procedure to check on you and address any questions or concerns that you may have regarding the information given to you following your procedure. If we do not reach you, we will leave a message.  However, if you are feeling well and you are not experiencing any problems, there is no need to return our call.  We will assume that you have returned to your regular daily activities without incident.  If any biopsies were taken you will be contacted by phone or by letter within the next 1-3 weeks.  Please call us at [phone] if you have not heard about the biopsies in 3 weeks.    SIGNATURES/CONFIDENTIALITY: You and/or your care partner have signed paperwork which will be entered into your electronic medical record.  These signatures attest to the fact that that the information above on your After Visit Summary has been reviewed and is understood.  Full responsibility of the confidentiality of this discharge information lies with you and/or your care-partner.

## 2015-08-10 NOTE — Op Note (Signed)
[place] Patient Name: Kathleen Arroyo Procedure Date: 08/10/2015 10:25 AM MRN: [id number] Endoscopist: Remo Lipps P. Havery Moros , MD Age: 60 Referring MD:  Date of Birth: 02/08/1956 Gender: Female Procedure:                Colonoscopy Indications:              Screening in patient at increased risk: Family                            history of 1st-degree relative with colorectal                            cancer around 60 years Medicines:                Monitored Anesthesia Care Procedure:                Pre-Anesthesia Assessment:                           - Prior to the procedure, a History and Physical                            was performed, and patient medications and                            allergies were reviewed. The patient's tolerance of                            previous anesthesia was also reviewed. The risks                            and benefits of the procedure and the sedation                            options and risks were discussed with the patient.                            All questions were answered, and informed consent                            was obtained. Prior Anticoagulants: The patient has                            taken no previous anticoagulant or antiplatelet                            agents. ASA Grade Assessment: II - A patient with                            mild systemic disease. After reviewing the risks                            and benefits, the patient was deemed in  satisfactory condition to undergo the procedure.                           After obtaining informed consent, the colonoscope                            was passed under direct vision. Throughout the                            procedure, the patient's blood pressure, pulse, and                            oxygen saturations were monitored continuously. The                            Model PCF-[id number]) scope was introduced                            through the anus and advanced to the the cecum,                            identified by appendiceal orifice and ileocecal                            valve. The colonoscopy was performed without                            difficulty. The patient tolerated the procedure                            well. The quality of the bowel preparation was                            adequate. The ileocecal valve, appendiceal orifice,                            and rectum were photographed. Scope In: 10:33:07 AM Scope Out: 11:12:16 AM Scope Withdrawal Time: 0 hours 33 minutes 43 seconds  Total Procedure Duration: 0 hours 39 minutes 9 seconds  Findings:                 The perianal and digital rectal examinations were                            normal.                           A large amount of liquid stool was found in the                            entire colon, making visualization difficult.                            Lavage of the area was performed using copious  amounts, resulting in clearance with adequate                            visualization. The preparation was initially only                            fair however following significant lavage it was                            adequate for screening purposes                           The colon was tortuous.                           A diffuse area of mild melanosis was found in the                            entire colon.                           A roughly 20-24 mm polyp was found in the cecum.                            The polyp was semi-sessile. The polyp was removed                            with a piecemeal technique using a cold snare.                            Resection and retrieval were complete. One part of                            the inferior portion would not cut-through with                            cold snare, thus a short burst of "pulse cut slow"                             setting was used to remove this piece. It was                            removed easily. The polypectomy defect seemed                            slightly deeper than expected but no obvious                            evidence of perforation. One hemostatic clip was                            successfully placed at this site to close the  defect.                           A localized area of mucosa was found in the distal                            rectum which appeared abnormal. Just proximal to                            the dentate line there was a small area of nodular                            / inflamed tissue. Biopsies were taken with a cold                            forceps for histology to ensure no adenomatous                            changes.                           Non-bleeding internal hemorrhoids were found during                            retroflexion. The hemorrhoids were moderate.                           The exam was otherwise without abnormality. Complications:            No immediate complications. Estimated blood loss:                            Minimal. Estimated Blood Loss:     Estimated blood loss was minimal. Impression:               - Stool in the entire examined colon, lavaged with                            subsequent adequate views.                           - Tortuous colon.                           - Melanosis in the colon.                           - One large polyp in the cecum, removed piecemeal                            as outlined above. Resected and retrieved. Clip was                            placed.                           - Focal  area of abnormal appearing mucosa in the                            distal rectum. Biopsied.                           - Non-bleeding internal hemorrhoids.                           - The examination was otherwise normal. Recommendation:           - Patient has a contact  number available for                            emergencies. The signs and symptoms of potential                            delayed complications were discussed with the                            patient. Return to normal activities tomorrow.                            Written discharge instructions were provided to the                            patient.                           - Resume previous diet.                           - Continue present medications.                           - No aspirin, ibuprofen, naproxen, or other                            non-steroidal anti-inflammatory drugs for 2 weeks                            after polyp removal.                           - Await pathology results.                           - Repeat colonoscopy is recommended for                            surveillance in 3 months to assess the polypectomy                            site and ensure no residual polyp Remo Lipps P. Armbruster, MD 08/10/2015 11:24:45 AM This report has been signed electronically.

## 2015-08-11 ENCOUNTER — Telehealth: Payer: Self-pay | Admitting: *Deleted

## 2015-08-11 NOTE — Telephone Encounter (Signed)
  Follow up Call-  Call back number 08/10/2015  Post procedure Call Back phone  # [phone]  Permission to leave phone message Yes     Patient questions:  Do you have a fever, pain , or abdominal swelling? No. Pain Score  0 *  Have you tolerated food without any problems? Yes.    Have you been able to return to your normal activities? Yes.    Do you have any questions about your discharge instructions: Diet   No. Medications  No. Follow up visit  No.  Do you have questions or concerns about your Care? No.  Actions: * If pain score is 4 or above: No action needed, pain <4.

## 2015-09-25 ENCOUNTER — Encounter: Payer: Self-pay | Admitting: Gynecology

## 2015-09-25 ENCOUNTER — Ambulatory Visit (INDEPENDENT_AMBULATORY_CARE_PROVIDER_SITE_OTHER): Payer: Managed Care, Other (non HMO) | Admitting: Gynecology

## 2015-09-25 VITALS — BP 118/80 | Ht 66.0 in | Wt 119.0 lb

## 2015-09-25 DIAGNOSIS — K649 Unspecified hemorrhoids: Secondary | ICD-10-CM | POA: Diagnosis not present

## 2015-09-25 DIAGNOSIS — Z01419 Encounter for gynecological examination (general) (routine) without abnormal findings: Secondary | ICD-10-CM

## 2015-09-25 DIAGNOSIS — M8580 Other specified disorders of bone density and structure, unspecified site: Secondary | ICD-10-CM

## 2015-09-25 DIAGNOSIS — D251 Intramural leiomyoma of uterus: Secondary | ICD-10-CM

## 2015-09-25 DIAGNOSIS — Z13220 Encounter for screening for lipoid disorders: Secondary | ICD-10-CM

## 2015-09-25 LAB — CBC WITH DIFFERENTIAL/PLATELET
BASOS PCT: 1 %
Basophils Absolute: 36 cells/uL (ref 0–200)
EOS ABS: 144 {cells}/uL (ref 15–500)
EOS PCT: 4 %
HCT: 42.6 % (ref 35.0–45.0)
Hemoglobin: 14.6 g/dL (ref 11.7–15.5)
LYMPHS PCT: 51 %
Lymphs Abs: 1836 cells/uL (ref 850–3900)
MCH: 31.9 pg (ref 27.0–33.0)
MCHC: 34.3 g/dL (ref 32.0–36.0)
MCV: 93.2 fL (ref 80.0–100.0)
MONOS PCT: 10 %
MPV: 10.3 fL (ref 7.5–12.5)
Monocytes Absolute: 360 cells/uL (ref 200–950)
Neutro Abs: 1224 cells/uL — ABNORMAL LOW (ref 1500–7800)
Neutrophils Relative %: 34 %
PLATELETS: 194 10*3/uL (ref 140–400)
RBC: 4.57 MIL/uL (ref 3.80–5.10)
RDW: 13.3 % (ref 11.0–15.0)
WBC: 3.6 10*3/uL — AB (ref 3.8–10.8)

## 2015-09-25 LAB — URINALYSIS W MICROSCOPIC + REFLEX CULTURE
BACTERIA UA: NONE SEEN [HPF]
Bilirubin Urine: NEGATIVE
CASTS: NONE SEEN [LPF]
CRYSTALS: NONE SEEN [HPF]
Glucose, UA: NEGATIVE
HGB URINE DIPSTICK: NEGATIVE
KETONES UR: NEGATIVE
Leukocytes, UA: NEGATIVE
Nitrite: NEGATIVE
Protein, ur: NEGATIVE
RBC / HPF: NONE SEEN RBC/HPF (ref <=2)
SQUAMOUS EPITHELIAL / LPF: NONE SEEN [HPF] (ref <=5)
Specific Gravity, Urine: 1.011 (ref 1.001–1.035)
WBC, UA: NONE SEEN WBC/HPF (ref <=5)
Yeast: NONE SEEN [HPF]
pH: 6 (ref 5.0–8.0)

## 2015-09-25 LAB — COMPREHENSIVE METABOLIC PANEL
ALK PHOS: 44 U/L (ref 33–130)
ALT: 17 U/L (ref 6–29)
AST: 22 U/L (ref 10–35)
Albumin: 4.2 g/dL (ref 3.6–5.1)
BUN: 11 mg/dL (ref 7–25)
CHLORIDE: 108 mmol/L (ref 98–110)
CO2: 25 mmol/L (ref 20–31)
Calcium: 9.8 mg/dL (ref 8.6–10.4)
Creat: 0.79 mg/dL (ref 0.50–0.99)
GLUCOSE: 89 mg/dL (ref 65–99)
POTASSIUM: 5.3 mmol/L (ref 3.5–5.3)
SODIUM: 144 mmol/L (ref 135–146)
Total Bilirubin: 0.8 mg/dL (ref 0.2–1.2)
Total Protein: 6.5 g/dL (ref 6.1–8.1)

## 2015-09-25 LAB — LIPID PANEL
CHOL/HDL RATIO: 1.7 ratio (ref <=5.0)
Cholesterol: 170 mg/dL (ref 125–200)
HDL: 98 mg/dL (ref >=46)
LDL CALC: 58 mg/dL (ref <130)
Triglycerides: 69 mg/dL (ref <150)
VLDL: 14 mg/dL (ref <30)

## 2015-09-25 NOTE — Patient Instructions (Signed)
Follow up for bone density as scheduled.  You may obtain a copy of any labs that were done today by logging onto MyChart as outlined in the instructions provided with your AVS (after visit summary). The office will not call with normal lab results but certainly if there are any significant abnormalities then we will contact you.   Health Maintenance Adopting a healthy lifestyle and getting preventive care can go a long way to promote health and wellness. Talk with your health care provider about what schedule of regular examinations is right for you. This is a good chance for you to check in with your provider about disease prevention and staying healthy. In between checkups, there are plenty of things you can do on your own. Experts have done a lot of research about which lifestyle changes and preventive measures are most likely to keep you healthy. Ask your health care provider for more information. WEIGHT AND DIET  Eat a healthy diet  Be sure to include plenty of vegetables, fruits, low-fat dairy products, and lean protein.  Do not eat a lot of foods high in solid fats, added sugars, or salt.  Get regular exercise. This is one of the most important things you can do for your health.  Most adults should exercise for at least 150 minutes each week. The exercise should increase your heart rate and make you sweat (moderate-intensity exercise).  Most adults should also do strengthening exercises at least twice a week. This is in addition to the moderate-intensity exercise.  Maintain a healthy weight  Body mass index (BMI) is a measurement that can be used to identify possible weight problems. It estimates body fat based on height and weight. Your health care provider can help determine your BMI and help you achieve or maintain a healthy weight.  For females 61 years of age and older:   A BMI below 18.5 is considered underweight.  A BMI of 18.5 to 24.9 is normal.  A BMI of 25 to 29.9 is  considered overweight.  A BMI of 30 and above is considered obese.  Watch levels of cholesterol and blood lipids  You should start having your blood tested for lipids and cholesterol at 61 years of age, then have this test every 5 years.  You may need to have your cholesterol levels checked more often if:  Your lipid or cholesterol levels are high.  You are older than 61 years of age.  You are at high risk for heart disease.  CANCER SCREENING   Lung Cancer  Lung cancer screening is recommended for adults 30-82 years old who are at high risk for lung cancer because of a history of smoking.  A yearly low-dose CT scan of the lungs is recommended for people who:  Currently smoke.  Have quit within the past 15 years.  Have at least a 30-pack-year history of smoking. A pack year is smoking an average of one pack of cigarettes a day for 1 year.  Yearly screening should continue until it has been 15 years since you quit.  Yearly screening should stop if you develop a health problem that would prevent you from having lung cancer treatment.  Breast Cancer  Practice breast self-awareness. This means understanding how your breasts normally appear and feel.  It also means doing regular breast self-exams. Let your health care provider know about any changes, no matter how small.  If you are in your 20s or 30s, you should have a clinical breast exam (  CBE) by a health care provider every 1-3 years as part of a regular health exam.  If you are 61 or older, have a CBE every year. Also consider having a breast X-ray (mammogram) every year.  If you have a family history of breast cancer, talk to your health care provider about genetic screening.  If you are at high risk for breast cancer, talk to your health care provider about having an MRI and a mammogram every year.  Breast cancer gene (BRCA) assessment is recommended for women who have family members with BRCA-related cancers.  BRCA-related cancers include:  Breast.  Ovarian.  Tubal.  Peritoneal cancers.  Results of the assessment will determine the need for genetic counseling and BRCA1 and BRCA2 testing. Cervical Cancer Routine pelvic examinations to screen for cervical cancer are no longer recommended for nonpregnant women who are considered low risk for cancer of the pelvic organs (ovaries, uterus, and vagina) and who do not have symptoms. A pelvic examination may be necessary if you have symptoms including those associated with pelvic infections. Ask your health care provider if a screening pelvic exam is right for you.   The Pap test is the screening test for cervical cancer for women who are considered at risk.  If you had a hysterectomy for a problem that was not cancer or a condition that could lead to cancer, then you no longer need Pap tests.  If you are older than 61 years, and you have had normal Pap tests for the past 10 years, you no longer need to have Pap tests.  If you have had past treatment for cervical cancer or a condition that could lead to cancer, you need Pap tests and screening for cancer for at least 20 years after your treatment.  If you no longer get a Pap test, assess your risk factors if they change (such as having a new sexual partner). This can affect whether you should start being screened again.  Some women have medical problems that increase their chance of getting cervical cancer. If this is the case for you, your health care provider may recommend more frequent screening and Pap tests.  The human papillomavirus (HPV) test is another test that may be used for cervical cancer screening. The HPV test looks for the virus that can cause cell changes in the cervix. The cells collected during the Pap test can be tested for HPV.  The HPV test can be used to screen women 39 years of age and older. Getting tested for HPV can extend the interval between normal Pap tests from three to  five years.  An HPV test also should be used to screen women of any age who have unclear Pap test results.  After 61 years of age, women should have HPV testing as often as Pap tests.  Colorectal Cancer  This type of cancer can be detected and often prevented.  Routine colorectal cancer screening usually begins at 61 years of age and continues through 61 years of age.  Your health care provider may recommend screening at an earlier age if you have risk factors for colon cancer.  Your health care provider may also recommend using home test kits to check for hidden blood in the stool.  A small camera at the end of a tube can be used to examine your colon directly (sigmoidoscopy or colonoscopy). This is done to check for the earliest forms of colorectal cancer.  Routine screening usually begins at age 77.  Direct examination of the colon should be repeated every 5-10 years through 61 years of age. However, you may need to be screened more often if early forms of precancerous polyps or small growths are found. Skin Cancer  Check your skin from head to toe regularly.  Tell your health care provider about any new moles or changes in moles, especially if there is a change in a mole's shape or color.  Also tell your health care provider if you have a mole that is larger than the size of a pencil eraser.  Always use sunscreen. Apply sunscreen liberally and repeatedly throughout the day.  Protect yourself by wearing long sleeves, pants, a wide-brimmed hat, and sunglasses whenever you are outside. HEART DISEASE, DIABETES, AND HIGH BLOOD PRESSURE   Have your blood pressure checked at least every 1-2 years. High blood pressure causes heart disease and increases the risk of stroke.  If you are between 76 years and 55 years old, ask your health care provider if you should take aspirin to prevent strokes.  Have regular diabetes screenings. This involves taking a blood sample to check your  fasting blood sugar level.  If you are at a normal weight and have a low risk for diabetes, have this test once every three years after 61 years of age.  If you are overweight and have a high risk for diabetes, consider being tested at a younger age or more often. PREVENTING INFECTION  Hepatitis B  If you have a higher risk for hepatitis B, you should be screened for this virus. You are considered at high risk for hepatitis B if:  You were born in a country where hepatitis B is common. Ask your health care provider which countries are considered high risk.  Your parents were born in a high-risk country, and you have not been immunized against hepatitis B (hepatitis B vaccine).  You have HIV or AIDS.  You use needles to inject street drugs.  You live with someone who has hepatitis B.  You have had sex with someone who has hepatitis B.  You get hemodialysis treatment.  You take certain medicines for conditions, including cancer, organ transplantation, and autoimmune conditions. Hepatitis C  Blood testing is recommended for:  Everyone born from [birth year] through 1966.  Anyone with known risk factors for hepatitis C. Sexually transmitted infections (STIs)  You should be screened for sexually transmitted infections (STIs) including gonorrhea and chlamydia if:  You are sexually active and are younger than 61 years of age.  You are older than 61 years of age and your health care provider tells you that you are at risk for this type of infection.  Your sexual activity has changed since you were last screened and you are at an increased risk for chlamydia or gonorrhea. Ask your health care provider if you are at risk.  If you do not have HIV, but are at risk, it may be recommended that you take a prescription medicine daily to prevent HIV infection. This is called pre-exposure prophylaxis (PrEP). You are considered at risk if:  You are sexually active and do not regularly use condoms or  know the HIV status of your partner(s).  You take drugs by injection.  You are sexually active with a partner who has HIV. Talk with your health care provider about whether you are at high risk of being infected with HIV. If you choose to begin PrEP, you should first be tested for HIV. You should then be  tested every 3 months for as long as you are taking PrEP.  PREGNANCY   If you are premenopausal and you may become pregnant, ask your health care provider about preconception counseling.  If you may become pregnant, take 400 to 800 micrograms (mcg) of folic acid every day.  If you want to prevent pregnancy, talk to your health care provider about birth control (contraception). OSTEOPOROSIS AND MENOPAUSE   Osteoporosis is a disease in which the bones lose minerals and strength with aging. This can result in serious bone fractures. Your risk for osteoporosis can be identified using a bone density scan.  If you are 25 years of age or older, or if you are at risk for osteoporosis and fractures, ask your health care provider if you should be screened.  Ask your health care provider whether you should take a calcium or vitamin D supplement to lower your risk for osteoporosis.  Menopause may have certain physical symptoms and risks.  Hormone replacement therapy may reduce some of these symptoms and risks. Talk to your health care provider about whether hormone replacement therapy is right for you.  HOME CARE INSTRUCTIONS   Schedule regular health, dental, and eye exams.  Stay current with your immunizations.   Do not use any tobacco products including cigarettes, chewing tobacco, or electronic cigarettes.  If you are pregnant, do not drink alcohol.  If you are breastfeeding, limit how much and how often you drink alcohol.  Limit alcohol intake to no more than 1 drink per day for nonpregnant women. One drink equals 12 ounces of beer, 5 ounces of wine, or 1 ounces of hard liquor.  Do  not use street drugs.  Do not share needles.  Ask your health care provider for help if you need support or information about quitting drugs.  Tell your health care provider if you often feel depressed.  Tell your health care provider if you have ever been abused or do not feel safe at home. Document Released: 09/24/2010 Document Revised: 07/26/2013 Document Reviewed: 02/10/2013 [facility] Patient Information 2015 [place], [place]. This information is not intended to replace advice given to you by your health care provider. Make sure you discuss any questions you have with your health care provider.

## 2015-09-25 NOTE — Progress Notes (Signed)
    Kathleen Arroyo February 22, 1956 [id number]        60 y.o.  [id number]  for annual exam.  Doing well without complaints  Past medical history,surgical history, problem list, medications, allergies, family history and social history were all reviewed and documented as reviewed in the EPIC chart.  ROS:  Performed with pertinent positives and negatives included in the history, assessment and plan.   Additional significant findings :  None   Exam: Leanne Lovely Vitals:   09/25/15 0901  BP: 118/80  Height: 5\' 6"  (1.676 m)  Weight: 119 lb (53.978 kg)   General appearance:  Normal affect, orientation and appearance. Skin: Grossly normal HEENT: Without gross lesions.  No cervical or supraclavicular adenopathy. Thyroid normal.  Lungs:  Clear without wheezing, rales or rhonchi Cardiac: RR, without RMG Abdominal:  Soft, nontender, without masses, guarding, rebound, organomegaly or hernia Breasts:  Examined lying and sitting without masses, retractions, discharge or axillary adenopathy. Pelvic:  Ext/BUS/vagina With atrophic changes  Cervix with atrophic changes  Uterus retroverted, normal size, shape and contour, midline and mobile nontender   Adnexa without masses or tenderness    Anus and perineum with old external hemorrhoids  Rectovaginal normal sphincter tone without palpated masses or tenderness.    Assessment/Plan:  60 y.o. [id number] female for annual exam.   1. Postmenopausal/atrophic genital changes. Was using formulated estradiol vaginal cream for atrophic changes but discontinued and reports doing well without significant dryness or dyspareunia. No vaginal bleeding. Continue to monitor report any issues or vaginal bleeding. 2. Prior diagnosis of lichen sclerosus based on visual changes elsewhere. Exam is normal without symptoms. Continue monitor report any issues such as irritation or itching. 3. Osteopenia. DEXA 2015 T score -1.5 FRAX 12%/0.4%. Stable from prior  DEXA. Does report mild height loss. Will check baseline DEXA now she will schedule. Check vitamin D level. 4. External hemorrhoids. Not overly bothersome to the patient. 5. History of leiomyoma, small on ultrasound. Uterus retroverted grossly normal in size. Continue with annual exam monitoring. 6. Mammography today. Continue with annual mammography. SBE monthly reviewed. 7. Pap smear 2016. No Pap smear done today. No history of significant abnormal Pap smears previously. 8. Colonoscopy 2017. Repeat at their recommended interval. 9. Health maintenance. Baseline CBC, CMP, lipid profile, vitamin D, urinalysis ordered. Has thyroid checked elsewhere. Follow up 1 year, sooner as needed.   Anastasio Auerbach MD, 9:19 AM 09/25/2015

## 2015-09-26 LAB — VITAMIN D 25 HYDROXY (VIT D DEFICIENCY, FRACTURES): VIT D 25 HYDROXY: 76 ng/mL (ref 30–100)

## 2015-10-03 ENCOUNTER — Encounter: Payer: Self-pay | Admitting: Gastroenterology

## 2015-10-11 ENCOUNTER — Encounter: Payer: Self-pay | Admitting: Gynecology

## 2015-10-23 ENCOUNTER — Encounter: Payer: Self-pay | Admitting: Gastroenterology

## 2015-10-25 ENCOUNTER — Encounter: Payer: Self-pay | Admitting: Gastroenterology

## 2015-11-13 ENCOUNTER — Ambulatory Visit (AMBULATORY_SURGERY_CENTER): Payer: Self-pay | Admitting: *Deleted

## 2015-11-13 VITALS — Ht 65.0 in | Wt 121.0 lb

## 2015-11-13 DIAGNOSIS — Z86010 Personal history of colonic polyps: Secondary | ICD-10-CM

## 2015-11-13 MED ORDER — NA SULFATE-K SULFATE-MG SULF 17.5-3.13-1.6 GM/177ML PO SOLN
ORAL | 0 refills | Status: DC
Start: 2015-11-13 — End: 2015-11-23

## 2015-11-13 NOTE — Progress Notes (Signed)
Patient denies any allergies to eggs or soy. Patient denies any problems with anesthesia/sedation. Patient denies any oxygen use at home and does not take any diet/weight loss medications.  

## 2015-11-14 ENCOUNTER — Encounter: Payer: Self-pay | Admitting: Gastroenterology

## 2015-11-23 ENCOUNTER — Ambulatory Visit (AMBULATORY_SURGERY_CENTER): Payer: Managed Care, Other (non HMO) | Admitting: Gastroenterology

## 2015-11-23 ENCOUNTER — Encounter: Payer: Self-pay | Admitting: Gastroenterology

## 2015-11-23 VITALS — BP 105/61 | HR 82 | Temp 98.9°F | Resp 18 | Ht 65.0 in | Wt 121.0 lb

## 2015-11-23 DIAGNOSIS — D123 Benign neoplasm of transverse colon: Secondary | ICD-10-CM | POA: Diagnosis not present

## 2015-11-23 DIAGNOSIS — Z86010 Personal history of colonic polyps: Secondary | ICD-10-CM | POA: Diagnosis not present

## 2015-11-23 MED ORDER — FLEET ENEMA 7-19 GM/118ML RE ENEM
1.0000 | ENEMA | Freq: Once | RECTAL | Status: AC
  Administered 2015-11-23: 1 via RECTAL

## 2015-11-23 MED ORDER — SODIUM CHLORIDE 0.9 % IV SOLN: 500.0000 mL | INTRAVENOUS | Status: AC

## 2015-11-23 NOTE — Progress Notes (Signed)
Pt states she is concerned she is not completely cleaned out. She states that she is still expelling brown, fairly cloudy. Discussed with Dr. Havery Moros and enema ordered. Pt completed with good results. Now clear yellow output.

## 2015-11-23 NOTE — Progress Notes (Signed)
Called to room to assist during endoscopic procedure.  Patient ID and intended procedure confirmed with present staff. Received instructions for my participation in the procedure from the performing physician.  

## 2015-11-23 NOTE — Progress Notes (Signed)
A and O x3. Report to RN. Tolerated MAC anesthesia well. 

## 2015-11-23 NOTE — Op Note (Signed)
[facility] Patient Name: Kathleen Arroyo Procedure Date: 11/23/2015 10:25 AM MRN: [id number] Endoscopist: Remo Lipps P. Havery Moros , MD Age: 60 Referring MD:  Date of Birth: March 22, 1956 Gender: Female Account #: [id number] Procedure:                Colonoscopy Indications:              High risk colon cancer surveillance: Personal                            history of colonic polyps Medicines:                Monitored Anesthesia Care Procedure:                Pre-Anesthesia Assessment:                           - Prior to the procedure, a History and Physical                            was performed, and patient medications and                            allergies were reviewed. The patient's tolerance of                            previous anesthesia was also reviewed. The risks                            and benefits of the procedure and the sedation                            options and risks were discussed with the patient.                            All questions were answered, and informed consent                            was obtained. Prior Anticoagulants: The patient has                            taken no previous anticoagulant or antiplatelet                            agents. ASA Grade Assessment: II - A patient with                            mild systemic disease. After reviewing the risks                            and benefits, the patient was deemed in                            satisfactory condition to undergo the procedure.  After obtaining informed consent, the colonoscope                            was passed under direct vision. Throughout the                            procedure, the patient's blood pressure, pulse, and                            oxygen saturations were monitored continuously. The                            Model PCF-H190L [id number]) scope was introduced                            through the anus and  advanced to the the cecum,                            identified by appendiceal orifice and ileocecal                            valve. The colonoscopy was performed without                            difficulty. The patient tolerated the procedure                            well. The quality of the bowel preparation was                            adequate. The ileocecal valve, appendiceal orifice,                            and rectum were photographed. Scope In: 10:34:01 AM Scope Out: 10:51:57 AM Scope Withdrawal Time: 0 hours 12 minutes 45 seconds  Total Procedure Duration: 0 hours 17 minutes 56 seconds  Findings:                 The perianal and digital rectal examinations were                            normal.                           A diffuse area of melanosis was found in the entire                            colon.                           A large post polypectomy scar was found in the                            cecum. There was no overt or obvious evidence of  the previous polyp, however with a ? diminutive                            focal area of possible residual vs. normal. This                            was biopsied with a cold forceps for histology to                            ensure no adenomatous.                           A diminutive polyp was found in the transverse                            colon. The polyp was sessile. The polyp was removed                            with a cold biopsy forceps. Resection and retrieval                            were complete.                           Non-bleeding internal hemorrhoids were found during                            retroflexion with assciated inflammatory change.                            This was previously biopsied and no evidence of                            adenomatous changes, no further biopsies obtained                            today.                           The exam was  otherwise without abnormality. Complications:            No immediate complications. Estimated blood loss:                            Minimal. Estimated Blood Loss:     Estimated blood loss was minimal. Impression:               - Melanosis in the colon.                           - Post-polypectomy scar in the cecum. Appears for                            the most part normal, one biopsy.                           -  One diminutive polyp in the transverse colon,                            removed with a cold biopsy forceps. Resected and                            retrieved.                           - Non-bleeding internal hemorrhoids.                           - The examination was otherwise normal. Recommendation:           - Patient has a contact number available for                            emergencies. The signs and symptoms of potential                            delayed complications were discussed with the                            patient. Return to normal activities tomorrow.                            Written discharge instructions were provided to the                            patient.                           - Resume previous diet.                           - Continue present medications.                           - Await pathology results.                           - Repeat colonoscopy in 3 years for surveillance. Remo Lipps P. Havery Moros, MD 11/23/2015 10:58:34 AM This report has been signed electronically.

## 2015-11-23 NOTE — Patient Instructions (Signed)
YOU HAD AN ENDOSCOPIC PROCEDURE TODAY AT [facility]:   Refer to the procedure report that was given to you for any specific questions about what was found during the examination.  If the procedure report does not answer your questions, please call your gastroenterologist to clarify.  If you requested that your care partner not be given the details of your procedure findings, then the procedure report has been included in a sealed envelope for you to review at your convenience later.  YOU SHOULD EXPECT: Some feelings of bloating in the abdomen. Passage of more gas than usual.  Walking can help get rid of the air that was put into your GI tract during the procedure and reduce the bloating. If you had a lower endoscopy (such as a colonoscopy or flexible sigmoidoscopy) you may notice spotting of blood in your stool or on the toilet paper. If you underwent a bowel prep for your procedure, you may not have a normal bowel movement for a few days.  Please Note:  You might notice some irritation and congestion in your nose or some drainage.  This is from the oxygen used during your procedure.  There is no need for concern and it should clear up in a day or so.  SYMPTOMS TO REPORT IMMEDIATELY:   Following lower endoscopy (colonoscopy or flexible sigmoidoscopy):  Excessive amounts of blood in the stool  Significant tenderness or worsening of abdominal pains  Swelling of the abdomen that is new, acute  Fever of 100F or higher   Following upper endoscopy (EGD)  Vomiting of blood or coffee ground material  New chest pain or pain under the shoulder blades  Painful or persistently difficult swallowing  New shortness of breath  Fever of 100F or higher  Black, tarry-looking stools  For urgent or emergent issues, a gastroenterologist can be reached at any hour by calling [phone].   DIET:  We do recommend a small meal at first, but then you may proceed to your regular diet.  Drink  plenty of fluids but you should avoid alcoholic beverages for 24 hours. Try to increase the fiber in your diet due to Diverticulosis.  ACTIVITY:  You should plan to take it easy for the rest of today and you should NOT DRIVE or use heavy machinery until tomorrow (because of the sedation medicines used during the test).    FOLLOW UP: Our staff will call the number listed on your records the next business day following your procedure to check on you and address any questions or concerns that you may have regarding the information given to you following your procedure. If we do not reach you, we will leave a message.  However, if you are feeling well and you are not experiencing any problems, there is no need to return our call.  We will assume that you have returned to your regular daily activities without incident.  If any biopsies were taken you will be contacted by phone or by letter within the next 1-3 weeks.  Please call us at [phone] if you have not heard about the biopsies in 3 weeks.    SIGNATURES/CONFIDENTIALITY: You and/or your care partner have signed paperwork which will be entered into your electronic medical record.  These signatures attest to the fact that that the information above on your After Visit Summary has been reviewed and is understood.  Full responsibility of the confidentiality of this discharge information lies with you and/or your care-partner.  Read all of the handouts given to you by your recovery room nurse.   Thank-you for choosing [place] for your healthcare needs today.

## 2015-11-24 ENCOUNTER — Telehealth: Payer: Self-pay | Admitting: *Deleted

## 2015-11-24 NOTE — Telephone Encounter (Signed)
  Follow up Call-  Call back number 11/23/2015 08/10/2015  Post procedure Call Back phone  # [phone]  Permission to leave phone message Yes Yes  Some recent data might be hidden     Patient questions:  Do you have a fever, pain , or abdominal swelling? No. Pain Score  0 *  Have you tolerated food without any problems? Yes.    Have you been able to return to your normal activities? Yes.    Do you have any questions about your discharge instructions: Diet   No. Medications  No. Follow up visit  No.  Do you have questions or concerns about your Care? No.  Actions: * If pain score is 4 or above: No action needed, pain <4.  Answered pt's questions about hemorrhoids.

## 2015-11-30 ENCOUNTER — Encounter: Payer: Self-pay | Admitting: Gastroenterology

## 2016-01-09 ENCOUNTER — Ambulatory Visit (INDEPENDENT_AMBULATORY_CARE_PROVIDER_SITE_OTHER): Payer: Managed Care, Other (non HMO)

## 2016-01-09 ENCOUNTER — Encounter: Payer: Self-pay | Admitting: Gynecology

## 2016-01-09 ENCOUNTER — Other Ambulatory Visit: Payer: Self-pay | Admitting: Gynecology

## 2016-01-09 DIAGNOSIS — Z13820 Encounter for screening for osteoporosis: Secondary | ICD-10-CM

## 2016-01-09 DIAGNOSIS — M8580 Other specified disorders of bone density and structure, unspecified site: Secondary | ICD-10-CM | POA: Diagnosis not present

## 2016-01-11 ENCOUNTER — Encounter: Payer: Self-pay | Admitting: Gynecology

## 2016-01-11 ENCOUNTER — Telehealth: Payer: Self-pay | Admitting: Gynecology

## 2016-01-11 NOTE — Telephone Encounter (Signed)
Tell patient she had a slight decline of her bone density at all the measured sites but not terrible. Would not recommend doing anything different at this point. Her vitamin D level looked great. Continue with exercise on a regular basis. Recheck bone density in 2 years.

## 2016-01-12 NOTE — Telephone Encounter (Signed)
Pt informed with the below note. 

## 2016-08-07 ENCOUNTER — Encounter: Payer: Self-pay | Admitting: Gynecology

## 2016-09-30 ENCOUNTER — Encounter: Payer: Managed Care, Other (non HMO) | Admitting: Gynecology

## 2016-12-09 ENCOUNTER — Encounter: Payer: Managed Care, Other (non HMO) | Admitting: Gynecology

## 2016-12-31 ENCOUNTER — Ambulatory Visit (INDEPENDENT_AMBULATORY_CARE_PROVIDER_SITE_OTHER): Payer: Managed Care, Other (non HMO) | Admitting: Gynecology

## 2016-12-31 ENCOUNTER — Encounter: Payer: Self-pay | Admitting: Gynecology

## 2016-12-31 VITALS — BP 116/74 | Ht 66.0 in | Wt 118.0 lb

## 2016-12-31 DIAGNOSIS — M8580 Other specified disorders of bone density and structure, unspecified site: Secondary | ICD-10-CM

## 2016-12-31 DIAGNOSIS — Z13220 Encounter for screening for lipoid disorders: Secondary | ICD-10-CM | POA: Diagnosis not present

## 2016-12-31 DIAGNOSIS — N952 Postmenopausal atrophic vaginitis: Secondary | ICD-10-CM | POA: Diagnosis not present

## 2016-12-31 DIAGNOSIS — Z01411 Encounter for gynecological examination (general) (routine) with abnormal findings: Secondary | ICD-10-CM

## 2016-12-31 LAB — CBC WITH DIFFERENTIAL/PLATELET
BASOS PCT: 0.8 %
Basophils Absolute: 29 cells/uL (ref 0–200)
Eosinophils Absolute: 119 cells/uL (ref 15–500)
Eosinophils Relative: 3.3 %
HEMATOCRIT: 41.4 % (ref 35.0–45.0)
HEMOGLOBIN: 13.9 g/dL (ref 11.7–15.5)
LYMPHS ABS: 1566 {cells}/uL (ref 850–3900)
MCH: 32.2 pg (ref 27.0–33.0)
MCHC: 33.6 g/dL (ref 32.0–36.0)
MCV: 95.8 fL (ref 80.0–100.0)
MPV: 10.4 fL (ref 7.5–12.5)
Monocytes Relative: 8.9 %
NEUTROS ABS: 1566 {cells}/uL (ref 1500–7800)
NEUTROS PCT: 43.5 %
Platelets: 187 10*3/uL (ref 140–400)
RBC: 4.32 10*6/uL (ref 3.80–5.10)
RDW: 11.7 % (ref 11.0–15.0)
Total Lymphocyte: 43.5 %
WBC: 3.6 10*3/uL — AB (ref 3.8–10.8)
WBCMIX: 320 {cells}/uL (ref 200–950)

## 2016-12-31 LAB — COMPREHENSIVE METABOLIC PANEL
AG RATIO: 1.9 (calc) (ref 1.0–2.5)
ALBUMIN MSPROF: 4.3 g/dL (ref 3.6–5.1)
ALKALINE PHOSPHATASE (APISO): 52 U/L (ref 33–130)
ALT: 24 U/L (ref 6–29)
AST: 34 U/L (ref 10–35)
BILIRUBIN TOTAL: 1 mg/dL (ref 0.2–1.2)
BUN: 8 mg/dL (ref 7–25)
CALCIUM: 9.8 mg/dL (ref 8.6–10.4)
CHLORIDE: 105 mmol/L (ref 98–110)
CO2: 28 mmol/L (ref 20–32)
Creat: 0.86 mg/dL (ref 0.50–0.99)
GLOBULIN: 2.3 g/dL (ref 1.9–3.7)
Glucose, Bld: 90 mg/dL (ref 65–99)
POTASSIUM: 5 mmol/L (ref 3.5–5.3)
SODIUM: 141 mmol/L (ref 135–146)
Total Protein: 6.6 g/dL (ref 6.1–8.1)

## 2016-12-31 LAB — VITAMIN D 25 HYDROXY (VIT D DEFICIENCY, FRACTURES): VIT D 25 HYDROXY: 59 ng/mL (ref 30–100)

## 2016-12-31 LAB — LIPID PANEL
CHOLESTEROL: 184 mg/dL (ref <200)
HDL: 99 mg/dL (ref >50)
LDL CHOLESTEROL (CALC): 70 mg/dL
NON-HDL CHOLESTEROL (CALC): 85 mg/dL (ref <130)
TRIGLYCERIDES: 72 mg/dL (ref <150)
Total CHOL/HDL Ratio: 1.9 (calc) (ref <5.0)

## 2016-12-31 LAB — TSH: TSH: 3.63 m[IU]/L (ref 0.40–4.50)

## 2016-12-31 NOTE — Patient Instructions (Signed)
Followup in one year for annual exam, sooner as needed. 

## 2016-12-31 NOTE — Progress Notes (Signed)
    Kathleen Arroyo [birth year] [id number]        61 y.o.  [id number] for annual gynecologic exam.    Past medical history,surgical history, problem list, medications, allergies, family history and social history were all reviewed and documented as reviewed in the EPIC chart.  ROS:  Performed with pertinent positives and negatives included in the history, assessment and plan.   Additional significant findings :  None   Exam: Caryn Bee assistant Vitals:   12/31/16 0839  BP: 116/74  Weight: 118 lb (53.5 kg)  Height: 5\' 6"  (1.676 m)   Body mass index is 19.05 kg/m.  General appearance:  Normal affect, orientation and appearance. Skin: Grossly normal HEENT: Without gross lesions.  No cervical or supraclavicular adenopathy. Thyroid normal.  Lungs:  Clear without wheezing, rales or rhonchi Cardiac: RR, without RMG Abdominal:  Soft, nontender, without masses, guarding, rebound, organomegaly or hernia Breasts:  Examined lying and sitting without masses, retractions, discharge or axillary adenopathy. Pelvic:  Ext, BUS, Vagina: Normal with atrophic changes  Cervix: With atrophic changes  Uterus: Anteverted, normal size, shape and contour, midline and mobile nontender   Adnexa: Without masses or tenderness    Anus and perineum: With old external hemorrhoids  Rectovaginal: Normal sphincter tone without palpated masses or tenderness.    Assessment/Plan:  61 y.o. [id number] female for annual gynecologic exam.   1. Postmenopausal/atrophic genital changes. No significant hot flushes, night sweats, vaginal dryness or bleeding. Continue to monitor report any issues or bleeding. 2. Osteopenia. DEXA 2017 T score -1.8 FRAX 14%/0.7%. Check baseline vitamin D and TSH today. Plan repeat DEXA at 2 year interval. 3. Mammography today. Breast exam normal today. 4. Pap smear 2016. No Pap smear done today. No history of abnormal Pap smears. Plan repeat Pap smear next year at 3 year interval per current  screening guidelines. 5. External hemorrhoids. Not overly bothersome to the patient. 6. History of leiomyoma small on ultrasound. Exam palpates normal size. Continue with annual monitoring with exam. 7. Colonoscopy 2017. Repeat at their recommended interval. 8. Health maintenance. Baseline CBC, CMP, lipid profile, vitamin D, TSH and urinalysis ordered. Follow up 1 year, sooner as needed.   Anastasio Auerbach MD, 9:14 AM 12/31/2016

## 2017-01-02 LAB — URINALYSIS W MICROSCOPIC + REFLEX CULTURE
BACTERIA UA: NONE SEEN /HPF
Bilirubin Urine: NEGATIVE
Glucose, UA: NEGATIVE
HGB URINE DIPSTICK: NEGATIVE
HYALINE CAST: NONE SEEN /LPF
Nitrites, Initial: NEGATIVE
RBC / HPF: NONE SEEN /HPF (ref 0–2)
SPECIFIC GRAVITY, URINE: 1.016 (ref 1.001–1.03)
SQUAMOUS EPITHELIAL / LPF: NONE SEEN /HPF (ref <=5)
WBC UA: NONE SEEN /HPF (ref 0–5)
pH: 5.5 (ref 5.0–8.0)

## 2017-01-02 LAB — URINE CULTURE
MICRO NUMBER: 81128725
RESULT: NO GROWTH
SPECIMEN QUALITY:: ADEQUATE

## 2017-01-02 LAB — CULTURE INDICATED

## 2017-02-24 ENCOUNTER — Telehealth: Payer: Self-pay | Admitting: Gastroenterology

## 2017-02-24 NOTE — Telephone Encounter (Signed)
I reviewed her prior procedures - colonoscopy x 2 last year for large cecal polyp which was removed. I suspect she very likely had bleeding from hemorrhoids which were inflamed on prior colonoscopy. If she is constipated, she should treat this with either fiber supplement or Miralax. I would also consider using hydrocortisone 1% cream on glycerin suppository, placed into anal canal, if symptoms recur. If she continues to have bleeding despite this she should call us back, may consider hemorrhoid banding. If bleeding is significant please let me know. Thanks

## 2017-02-24 NOTE — Telephone Encounter (Signed)
Patient had one episode of rectal bleeding this morning after a normal bowel movement. States it lasted about 5 minutes then stopped. She is at work now, [employer]. There are no APP appointments until Wednesday this week. Patient denies any SOB, rectal pain or dizziness, does have history or hemorrhoids.

## 2017-02-24 NOTE — Telephone Encounter (Signed)
Patient ok'd to lvm, left detailed message with recommendations about OTC glycerin suppository and hydrocortisone 1% cream. She is to call if symptoms do not improve or if bleeding is severe go to ED. Let her know that may need to have a hemorrhoid banding if bleeding continues.

## 2018-01-02 ENCOUNTER — Encounter: Payer: Managed Care, Other (non HMO) | Admitting: Gynecology

## 2018-01-06 ENCOUNTER — Ambulatory Visit (INDEPENDENT_AMBULATORY_CARE_PROVIDER_SITE_OTHER): Payer: Managed Care, Other (non HMO) | Admitting: Gynecology

## 2018-01-06 ENCOUNTER — Encounter: Payer: Self-pay | Admitting: Gynecology

## 2018-01-06 VITALS — BP 116/74 | Ht 65.5 in | Wt 121.0 lb

## 2018-01-06 DIAGNOSIS — M8580 Other specified disorders of bone density and structure, unspecified site: Secondary | ICD-10-CM | POA: Diagnosis not present

## 2018-01-06 DIAGNOSIS — Z01419 Encounter for gynecological examination (general) (routine) without abnormal findings: Secondary | ICD-10-CM

## 2018-01-06 DIAGNOSIS — N950 Postmenopausal bleeding: Secondary | ICD-10-CM | POA: Diagnosis not present

## 2018-01-06 DIAGNOSIS — N952 Postmenopausal atrophic vaginitis: Secondary | ICD-10-CM

## 2018-01-06 NOTE — Addendum Note (Signed)
Addended by: Nelva Nay on: 01/06/2018 10:22 AM   Modules accepted: Orders

## 2018-01-06 NOTE — Patient Instructions (Signed)
Follow-up for the ultrasound appointment as scheduled  Follow-up for the bone density as scheduled

## 2018-01-06 NOTE — Progress Notes (Signed)
    Kathleen Arroyo 12/30/1955 [id number]        62 y.o.  [id number] for annual gynecologic exam.  Notes an episode of passage of a clot several weeks ago.  Occurred as an isolated event.  No pain associated with this.  Thinks it was hemorrhoidal as she does have a history of hemorrhoids with bleeding in the past.  Also has a history of leiomyoma.  No urinary symptoms such as frequency dysuria urgency low back pain fever or chills.  Past medical history,surgical history, problem list, medications, allergies, family history and social history were all reviewed and documented as reviewed in the EPIC chart.  ROS:  Performed with pertinent positives and negatives included in the history, assessment and plan.   Additional significant findings : None   Exam: Caryn Bee assistant Vitals:   01/06/18 0859  BP: 116/74  Weight: 121 lb (54.9 kg)  Height: 5' 5.5" (1.664 m)   Body mass index is 19.83 kg/m.  General appearance:  Normal affect, orientation and appearance. Skin: Grossly normal HEENT: Without gross lesions.  No cervical or supraclavicular adenopathy. Thyroid normal.  Lungs:  Clear without wheezing, rales or rhonchi Cardiac: RR, without RMG Abdominal:  Soft, nontender, without masses, guarding, rebound, organomegaly or hernia Breasts:  Examined lying and sitting without masses, retractions, discharge or axillary adenopathy. Pelvic:  Ext, BUS, Vagina: With atrophic changes  Cervix: With atrophic changes.  Pap smear done  Uterus: Anteverted, normal size, shape and contour, midline and mobile nontender   Adnexa: Without masses or tenderness    Anus and perineum: Normal   Rectovaginal: Normal sphincter tone without palpated masses or tenderness.    Assessment/Plan:  62 y.o. [id number] female for annual gynecologic exam.   1. Postmenopausal bleeding.  Patient feels it was probably rectal with her history of hemorrhoids and bleeding in the past.  We reviewed the differential to  include urinary, vaginal and rectal bleeding.  Colonoscopy 2017.  Differential with vaginal bleeding up to and including uterine cancer discussed.  Reviewed various options for evaluation.  Will initiate ultrasound for endometrial assessment.  If endometrial echo thin then will stop there as long she has no further bleeding.  If endometrial echo thicker than we will proceed with sonohysterogram and endometrial assessment.  Patient agrees with the plan and will schedule the appointment. 2. Osteopenia.  DEXA 2017 T score -1.8 FRAX 14% / 0.7%.  Schedule DEXA now at 2-year interval and she will follow-up for this.  Vitamin D level 59 last year. 3. Postmenopausal.  No significant menopausal symptoms. 4. History of leiomyoma.  Uterus palpates normal.  Will assess with ultrasound as above. 5. Mammography today.  Continue with annual mammography when due.  Breast exam normal today. 6. Pap smear 2016.  Pap smear done today.  No history of abnormal Pap smears previously. 7. Health maintenance.  No routine lab work done as patient reports she had this done recently at her primary physician's office.  Follow-up for sonohysterogram and DEXA as scheduled.    Anastasio Auerbach MD, 9:29 AM 01/06/2018

## 2018-01-07 LAB — URINALYSIS, COMPLETE W/RFL CULTURE
BILIRUBIN URINE: NEGATIVE
Bacteria, UA: NONE SEEN /HPF
GLUCOSE, UA: NEGATIVE
Hgb urine dipstick: NEGATIVE
Hyaline Cast: NONE SEEN /LPF
KETONES UR: NEGATIVE
LEUKOCYTE ESTERASE: NEGATIVE
Nitrites, Initial: NEGATIVE
PROTEIN: NEGATIVE
RBC / HPF: NONE SEEN /HPF (ref 0–2)
Specific Gravity, Urine: 1.007 (ref 1.001–1.03)
Squamous Epithelial / LPF: NONE SEEN /HPF (ref <=5)
WBC UA: NONE SEEN /HPF (ref 0–5)
pH: 7.5 (ref 5.0–8.0)

## 2018-01-07 LAB — NO CULTURE INDICATED

## 2018-01-08 LAB — PAP IG W/ RFLX HPV ASCU

## 2018-01-26 ENCOUNTER — Other Ambulatory Visit: Payer: Self-pay | Admitting: Gynecology

## 2018-01-26 DIAGNOSIS — N950 Postmenopausal bleeding: Secondary | ICD-10-CM

## 2018-02-09 ENCOUNTER — Ambulatory Visit: Payer: Managed Care, Other (non HMO) | Admitting: Gynecology

## 2018-02-09 ENCOUNTER — Encounter: Payer: Self-pay | Admitting: Gynecology

## 2018-02-09 ENCOUNTER — Ambulatory Visit (INDEPENDENT_AMBULATORY_CARE_PROVIDER_SITE_OTHER): Payer: Managed Care, Other (non HMO)

## 2018-02-09 VITALS — BP 118/76

## 2018-02-09 DIAGNOSIS — N950 Postmenopausal bleeding: Secondary | ICD-10-CM

## 2018-02-09 NOTE — Progress Notes (Signed)
    Kathleen Arroyo [birth year] [id number]        62 y.o.  Kathleen Arroyo presents for sonohysterogram.  History of bleeding that she thought was hemorrhoidal.  Ultrasound ordered to rule out endometrial abnormalities.  History of leiomyoma on prior ultrasounds.  Past medical history,surgical history, problem list, medications, allergies, family history and social history were all reviewed and documented in the EPIC chart.  Directed ROS with pertinent positives and negatives documented in the history of present illness/assessment and plan.  Exam: [place] assistant BP 118/76 General appearance:  Normal Abdomen soft nontender without masses guarding rebound Pelvic external BUS vagina with atrophic changes.  Cervix with atrophic changes.  Uterus retroverted grossly normal in size midline mobile nontender.  Adnexa without masses or tenderness.  Ultrasound transvaginal and transabdominal shows uterus 112 x 32 x 31 mm in size with endometrial echo 4.3 mm.  Small myoma noted at 22 x 15 mm.  Right and left ovaries normal.  Cul-de-sac negative.  Sonohysterogram performed, sterile technique, single-tooth tenaculum anterior lip stabilization with mild dilatation of external loss to allow catheter placement.  Good distention with no abnormalities.  Endometrial biopsy taken noting catheter clearly within the endometrial cavity and very scant return.  Assessment/Plan:  62 y.o. Kathleen Arroyo with episode of bleeding thought to be hemorrhoidal.  Ultrasound shows thin endometrium at 4.3 mm.  Sonohysterogram with no cavitary defects.  Scant biopsy returned.  Reviewed possibilities to include nondiagnostic on biopsy due to scant return which I think given the circumstances is acceptable.  Patient will follow-up for biopsy results.  She will follow-up if any further bleeding.    Anastasio Auerbach MD, 12:55 PM 02/09/2018

## 2018-02-09 NOTE — Patient Instructions (Signed)
Office will call you with biopsy results 

## 2018-11-14 ENCOUNTER — Encounter: Payer: Self-pay | Admitting: Gastroenterology

## 2018-11-23 ENCOUNTER — Encounter: Payer: Self-pay | Admitting: Gastroenterology

## 2018-12-07 ENCOUNTER — Other Ambulatory Visit: Payer: Self-pay

## 2018-12-07 ENCOUNTER — Ambulatory Visit (AMBULATORY_SURGERY_CENTER): Payer: Self-pay | Admitting: *Deleted

## 2018-12-07 VITALS — Temp 97.5°F | Ht 66.0 in | Wt 126.0 lb

## 2018-12-07 DIAGNOSIS — Z86010 Personal history of colonic polyps: Secondary | ICD-10-CM

## 2018-12-07 MED ORDER — PEG 3350-KCL-NA BICARB-NACL 420 G PO SOLR: 4000.0000 mL | Freq: Once | ORAL | 0 refills | Status: AC

## 2018-12-07 NOTE — Progress Notes (Signed)
No egg or soy allergy known to patient  No issues with past sedation with any surgeries  or procedures, no intubation problems  No diet pills per patient No home 02 use per patient  No blood thinners per patient  Pt denies issues with constipation  No A fib or A flutter  EMMI video sent to pt's e mail  

## 2018-12-15 ENCOUNTER — Encounter: Payer: Self-pay | Admitting: Gastroenterology

## 2018-12-22 ENCOUNTER — Encounter: Payer: Self-pay | Admitting: Gynecology

## 2018-12-28 ENCOUNTER — Encounter: Payer: Managed Care, Other (non HMO) | Admitting: Gastroenterology

## 2019-01-12 ENCOUNTER — Encounter: Payer: Managed Care, Other (non HMO) | Admitting: Gynecology

## 2021-01-16 ENCOUNTER — Telehealth: Payer: Self-pay | Admitting: *Deleted

## 2021-01-16 NOTE — Telephone Encounter (Signed)
1500- attempted to reach pt; no answer, LMOM to call back  1510-  attempted to reach pt again; [facility] to call back before 5 to reschedule PV or colonoscopy for 02-23-21 will be cancelled

## 2021-01-18 ENCOUNTER — Encounter: Payer: Self-pay | Admitting: Gastroenterology

## 2021-01-18 ENCOUNTER — Ambulatory Visit (AMBULATORY_SURGERY_CENTER): Payer: Managed Care, Other (non HMO)

## 2021-01-18 VITALS — Ht 66.0 in | Wt 125.0 lb

## 2021-01-18 DIAGNOSIS — Z86010 Personal history of colonic polyps: Secondary | ICD-10-CM

## 2021-01-18 MED ORDER — ONDANSETRON HCL 4 MG PO TABS: 4.0000 mg | ORAL_TABLET | ORAL | 0 refills | Status: AC

## 2021-01-18 MED ORDER — SUTAB 1479-225-188 MG PO TABS: 12.0000 | ORAL_TABLET | ORAL | 0 refills | Status: DC

## 2021-01-18 NOTE — Telephone Encounter (Signed)
Pt rescheduled PV for 01-18-21 at 1:00 pm

## 2021-01-18 NOTE — Progress Notes (Signed)
No egg or soy allergy known to patient  No issues known to pt with past sedation with any surgeries or procedures Patient denies ever being told they had issues or difficulty with intubation  No FH of Malignant Hyperthermia Pt is not on diet pills Pt is not on  home 02  Pt is not on blood thinners  Pt has issues with constipation  No A fib or A flutter  Pt is not vaccinated for covid, had covid in 11/2019.  Virtual previsit  NO PA's for preps discussed with pt In PV today  Discussed with pt there will be an out-of-pocket cost for prep and that varies from $0 to 70 +  dollars - pt verbalized understanding   Due to the COVID-19 pandemic we are asking patients to follow certain guidelines in PV and the [facility]   Pt aware of COVID protocols and [facility] guidelines

## 2021-02-20 ENCOUNTER — Telehealth: Payer: Self-pay | Admitting: Gastroenterology

## 2021-02-20 ENCOUNTER — Encounter: Payer: Self-pay | Admitting: Gastroenterology

## 2021-02-20 NOTE — Telephone Encounter (Signed)
Pt asking about how to decline HIV testing - explained we did not send this, only sent instructions via e mail- pt does not want this testing- instructed her to not sign, informed as far as I know, we do not send that   Pt verbalized understanding- informed her she will sign consent and Pre procedure acknowledgment form the day of her colon 12-2

## 2021-02-21 ENCOUNTER — Telehealth: Payer: Self-pay | Admitting: Gastroenterology

## 2021-02-21 NOTE — Telephone Encounter (Signed)
Patient picked up prep for procedure and has questions about the contents that was given to her.  Please call and explain.  Thank you.

## 2021-02-21 NOTE — Telephone Encounter (Signed)
Explained that the medication Zofran is to prevent nausea and to take it before each sutab dose. Pt verbalizes understanding.

## 2021-02-23 ENCOUNTER — Other Ambulatory Visit: Payer: Self-pay

## 2021-02-23 ENCOUNTER — Ambulatory Visit (AMBULATORY_SURGERY_CENTER): Payer: Managed Care, Other (non HMO) | Admitting: Gastroenterology

## 2021-02-23 ENCOUNTER — Encounter: Payer: Self-pay | Admitting: Gastroenterology

## 2021-02-23 VITALS — BP 97/50 | HR 75 | Temp 97.7°F | Resp 13 | Ht 66.0 in | Wt 125.0 lb

## 2021-02-23 DIAGNOSIS — D129 Benign neoplasm of anus and anal canal: Secondary | ICD-10-CM

## 2021-02-23 DIAGNOSIS — D127 Benign neoplasm of rectosigmoid junction: Secondary | ICD-10-CM

## 2021-02-23 DIAGNOSIS — Z800 Family history of malignant neoplasm of digestive organs: Secondary | ICD-10-CM

## 2021-02-23 DIAGNOSIS — D128 Benign neoplasm of rectum: Secondary | ICD-10-CM | POA: Diagnosis not present

## 2021-02-23 DIAGNOSIS — D125 Benign neoplasm of sigmoid colon: Secondary | ICD-10-CM | POA: Diagnosis not present

## 2021-02-23 DIAGNOSIS — Z86010 Personal history of colonic polyps: Secondary | ICD-10-CM | POA: Diagnosis present

## 2021-02-23 MED ORDER — SODIUM CHLORIDE 0.9 % IV SOLN: 500.0000 mL | Freq: Once | INTRAVENOUS | Status: DC

## 2021-02-23 NOTE — Progress Notes (Signed)
[facility] History and Physical   Primary Care Physician:  Ivan Anchors, MD   Reason for Procedure:   History of colon polyps  Plan:    colonoscopy     HPI: Kathleen Arroyo is a 65 y.o. female  here for colonoscopy surveillance - history of advanced adenoma removed in 2017. Patient denies any bowel symptoms at this time. Father had colon cancer dx age 38. Otherwise feels well without any cardiopulmonary symptoms.    Past Medical History:  Diagnosis Date   Adenomatous colon polyp    tubular   Allergy    SEASONAL   Atrophic vaginitis    Endometriosis    based upon History.  Not surgically confirmed   GERD (gastroesophageal reflux disease)    Hemorrhoids    Hiatal hernia    Hypothyroidism    IBS (irritable bowel syndrome)    Melanosis of colon    Osteopenia 2017   T score -1.8 FRAX 14%/0.7%   Osteopenia    Renal calculus    Tortuous colon    Uterine fibroid 2014    Ultrasound showed 34mm, 73mm    Past Surgical History:  Procedure Laterality Date   BUNIONECTOMY     Right foot bunionectomy and hammer toes bilaterallly   CESAREAN SECTION     x 1   COLONOSCOPY     DILATION AND CURETTAGE OF UTERUS     FOOT SURGERY Bilateral    X 2   POLYPECTOMY     UPPER GASTROINTESTINAL ENDOSCOPY      Prior to Admission medications   Medication Sig Start Date End Date Taking? Authorizing Provider  AMBULATORY NON FORMULARY MEDICATION Medication Name: Petra Kuba Thyroid 1 grain daily   Yes [provider]  AMBULATORY NON FORMULARY MEDICATION Medication Name: Omega Guard 2 capsules daily   Yes [provider]  AMBULATORY NON FORMULARY MEDICATION Medication Name: OptiFlora once daily   Yes [provider]  ASHWAGANDHA PO Take by mouth.   Yes [provider]  B Complex Vitamins (VITAMIN B COMPLEX PO) Take 1 tablet by mouth daily.   Yes [provider]  BIOTIN PO Take 5,000 mcg by mouth every other day.   Yes [provider]  Cholecalciferol (VITAMIN D3) 5000 units CAPS Take 1 capsule by mouth daily.   Yes [provider]  COLLAGEN PO Take 1 scoop by mouth daily.    Yes [provider]  Digestive Enzymes (DIGESTIVE ENZYME PO) Take by mouth.   Yes [provider]  Magnesium 400 MG TABS Take by mouth daily.   Yes [provider]  NATURE-THROID 65 MG tablet  10/28/18  Yes [provider]  NON FORMULARY Tumeric 1 capsule BID   Yes [provider]  NON FORMULARY    Yes [provider]  NON FORMULARY    Yes [provider]  NON FORMULARY    Yes [provider]  NP THYROID 60 MG tablet Take 60 mg by mouth every morning. 12/25/20  Yes [provider]  ondansetron (ZOFRAN) 4 MG tablet Take 1 tablet (4 mg total) by mouth as directed. Take 1 -4 mg Zofran 30-60 minutes before the prep dose the day before the colonoscopy Take 1-4 mg Zofran 30-60 minutes before the prep dose the day of the colonoscopy 01/18/21  Yes Patrcia Schnepp, Carlota Raspberry, MD  Kay    Yes [provider]  [place] care (helps with cortisol level)  Yes [provider]  Selenium (SELENIMIN PO) Take 200 mcg by mouth daily.    Yes [provider]  Wheat Dextrin (BENEFIBER PO) Take by mouth daily.    [provider]    Current Outpatient Medications  Medication Sig Dispense Refill   AMBULATORY NON FORMULARY MEDICATION Medication Name: Petra Kuba Thyroid 1 grain daily     AMBULATORY NON FORMULARY MEDICATION Medication Name: Omega Guard 2 capsules daily     AMBULATORY NON FORMULARY MEDICATION Medication Name: OptiFlora once daily     ASHWAGANDHA PO Take by mouth.     B Complex Vitamins (VITAMIN B COMPLEX PO) Take 1 tablet by mouth daily.     BIOTIN PO Take 5,000 mcg by mouth every other day.     Cholecalciferol (VITAMIN D3) 5000 units CAPS Take 1 capsule by mouth daily.     COLLAGEN PO  Take 1 scoop by mouth daily.      Digestive Enzymes (DIGESTIVE ENZYME PO) Take by mouth.     Magnesium 400 MG TABS Take by mouth daily.     NATURE-THROID 65 MG tablet      NON FORMULARY Tumeric 1 capsule BID     NON FORMULARY      NON FORMULARY      NON FORMULARY      NP THYROID 60 MG tablet Take 60 mg by mouth every morning.     ondansetron (ZOFRAN) 4 MG tablet Take 1 tablet (4 mg total) by mouth as directed. Take 1 -4 mg Zofran 30-60 minutes before the prep dose the day before the colonoscopy Take 1-4 mg Zofran 30-60 minutes before the prep dose the day of the colonoscopy 2 tablet 0   OVER THE COUNTER MEDICATION      OVER THE COUNTER MEDICATION Stress care (helps with cortisol level)     Selenium (SELENIMIN PO) Take 200 mcg by mouth daily.      Wheat Dextrin (BENEFIBER PO) Take by mouth daily.     Current Facility-Administered Medications  Medication Dose Route Frequency Provider Last Rate Last Admin   0.9 %  sodium chloride infusion  500 mL Intravenous Continuous Jacquel Redditt, Carlota Raspberry, MD       0.9 %  sodium chloride infusion  500 mL Intravenous Once Ben Habermann, Carlota Raspberry, MD        Allergies as of 02/23/2021   (No Known Allergies)    Family History  Problem Relation Age of Onset   Breast cancer Mother        Late 18's   Stroke Mother    Hypertension Mother    Colon cancer Father 69   Colon polyps Father    Diabetes Father    Heart disease Father    Hypertension Father    Heart disease Brother        MI   Cancer Maternal Grandmother        Leukemia   Esophageal cancer Neg Hx    Rectal cancer Neg Hx    Stomach cancer Neg Hx     Social History   Socioeconomic History   Marital status: Divorced    Spouse name: Not on file   Number of children: Not on file   Years of education: Not on file   Highest education level: Not on file  Occupational History   Not on file  Tobacco Use   Smoking status: Former    Types: Cigarettes    Quit date: 1996    Years since  quitting: 67.9  Passive exposure: Past   Smokeless tobacco: Never   Tobacco comments:    Social smoker ( a few times a year) late teens to thirties or early 52s then quit  Vaping Use   Vaping Use: Never used  Substance and Sexual Activity   Alcohol use: Yes    Comment: Rare-couple times per year   Drug use: Never   Sexual activity: Not Currently    Birth control/protection: Post-menopausal    Comment: 1st intercourse 89 yo-2 partners  Other Topics Concern   Not on file  Social History Narrative   Not on file   Social Determinants of Health   Financial Resource Strain: Not on file  Food Insecurity: Not on file  Transportation Needs: Not on file  Physical Activity: Not on file  Stress: Not on file  Social Connections: Not on file  Intimate Partner Violence: Not on file    Review of Systems: All other review of systems negative except as mentioned in the HPI.  Physical Exam: Vital signs BP 129/77   Pulse 98   Temp 97.7 F (36.5 C)   Resp 14   Ht 5\' 6"  (1.676 m)   Wt 125 lb (56.7 kg)   SpO2 100%   BMI 20.18 kg/m   General:   Alert,  Well-developed, pleasant and cooperative in NAD Lungs:  Clear throughout to auscultation.   Heart:  Regular rate and rhythm Abdomen:  Soft, nontender and nondistended.   Neuro/Psych:  Alert and cooperative. Normal mood and affect. A and O x 3  Jolly Mango, MD [facility]

## 2021-02-23 NOTE — Op Note (Signed)
[place] [facility] Patient Name: Kathleen Arroyo Procedure Date: 02/23/2021 1:59 PM MRN: [id number] Endoscopist: Remo Lipps P. Havery Moros , MD Age: 65 Referring MD:  Date of Birth: 1955/08/13 Gender: Female Account #: [id number] Procedure:                Colonoscopy Indications:              High risk colon cancer surveillance: Personal                            history of colonic polyps - advanced adenoma                            removed 07/2015, father had colon cancer dx age 74 Medicines:                Monitored Anesthesia Care Procedure:                Pre-Anesthesia Assessment:                           - Prior to the procedure, a History and Physical                            was performed, and patient medications and                            allergies were reviewed. The patient's tolerance of                            previous anesthesia was also reviewed. The risks                            and benefits of the procedure and the sedation                            options and risks were discussed with the patient.                            All questions were answered, and informed consent                            was obtained. Prior Anticoagulants: The patient has                            taken no previous anticoagulant or antiplatelet                            agents. ASA Grade Assessment: II - A patient with                            mild systemic disease. After reviewing the risks                            and benefits, the patient was deemed in  satisfactory condition to undergo the procedure.                           After obtaining informed consent, the colonoscope                            was passed under direct vision. Throughout the                            procedure, the patient's blood pressure, pulse, and                            oxygen saturations were monitored continuously. The                            Olympus  PCF-H190DL [id number]) Colonoscope was                            introduced through the anus and advanced to the the                            cecum, identified by appendiceal orifice and                            ileocecal valve. The colonoscopy was performed                            without difficulty. The patient tolerated the                            procedure well. The quality of the bowel                            preparation was good. The ileocecal valve,                            appendiceal orifice, and rectum were photographed. Scope In: 2:04:23 PM Scope Out: 2:23:58 PM Scope Withdrawal Time: 0 hours 14 minutes 9 seconds  Total Procedure Duration: 0 hours 19 minutes 35 seconds  Findings:                 The perianal and digital rectal examinations were                            normal.                           A 4 mm polyp was found in the sigmoid colon. The                            polyp was sessile. The polyp was removed with a                            cold snare. Resection and retrieval were complete.  A 4 mm polyp was found in the proximal rectum. The                            polyp was sessile. The polyp was removed with a                            cold snare. Resection and retrieval were complete.                           Internal hemorrhoids were found during                            retroflexion. The hemorrhoids were moderate and                            inflamed, similar to prior appearance 5 years ago.                           The exam was otherwise without abnormality. Prior                            polypectomy site in the cecum had no residual /                            recurrent polyp. Complications:            No immediate complications. Estimated blood loss:                            Minimal. Estimated Blood Loss:     Estimated blood loss was minimal. Impression:               - One 4 mm polyp in the sigmoid  colon, removed with                            a cold snare. Resected and retrieved.                           - One 4 mm polyp in the proximal rectum, removed                            with a cold snare. Resected and retrieved.                           - Internal hemorrhoids.                           - The examination was otherwise normal. Recommendation:           - Patient has a contact number available for                            emergencies. The signs and symptoms of potential  delayed complications were discussed with the                            patient. Return to normal activities tomorrow.                            Written discharge instructions were provided to the                            patient.                           - Resume previous diet.                           - Continue present medications.                           - Await pathology results. Remo Lipps P. Havery Moros, MD 02/23/2021 2:27:57 PM This report has been signed electronically.

## 2021-02-23 NOTE — Progress Notes (Signed)
A and O x3. Report to RN. Tolerated MAC anesthesia well. 

## 2021-02-23 NOTE — Progress Notes (Signed)
Called to room to assist during endoscopic procedure.  Patient ID and intended procedure confirmed with present staff. Received instructions for my participation in the procedure from the performing physician.  

## 2021-02-23 NOTE — Patient Instructions (Signed)
Handouts on polyps and hemorrhoids given to you today    YOU HAD AN ENDOSCOPIC PROCEDURE TODAY AT [facility]:   Refer to the procedure report that was given to you for any specific questions about what was found during the examination.  If the procedure report does not answer your questions, please call your gastroenterologist to clarify.  If you requested that your care partner not be given the details of your procedure findings, then the procedure report has been included in a sealed envelope for you to review at your convenience later.  YOU SHOULD EXPECT: Some feelings of bloating in the abdomen. Passage of more gas than usual.  Walking can help get rid of the air that was put into your GI tract during the procedure and reduce the bloating. If you had a lower endoscopy (such as a colonoscopy or flexible sigmoidoscopy) you may notice spotting of blood in your stool or on the toilet paper. If you underwent a bowel prep for your procedure, you may not have a normal bowel movement for a few days.  Please Note:  You might notice some irritation and congestion in your nose or some drainage.  This is from the oxygen used during your procedure.  There is no need for concern and it should clear up in a day or so.  SYMPTOMS TO REPORT IMMEDIATELY:  Following lower endoscopy (colonoscopy or flexible sigmoidoscopy):  Excessive amounts of blood in the stool  Significant tenderness or worsening of abdominal pains  Swelling of the abdomen that is new, acute  Fever of 100F or higher  For urgent or emergent issues, a gastroenterologist can be reached at any hour by calling [phone]. Do not use MyChart messaging for urgent concerns.    DIET:  We do recommend a small meal at first, but then you may proceed to your regular diet.  Drink plenty of fluids but you should avoid alcoholic beverages for 24 hours.  ACTIVITY:  You should plan to take it easy for the rest of today and you should  NOT DRIVE or use heavy machinery until tomorrow (because of the sedation medicines used during the test).    FOLLOW UP: Our staff will call the number listed on your records 48-72 hours following your procedure to check on you and address any questions or concerns that you may have regarding the information given to you following your procedure. If we do not reach you, we will leave a message.  We will attempt to reach you two times.  During this call, we will ask if you have developed any symptoms of COVID 19. If you develop any symptoms (ie: fever, flu-like symptoms, shortness of breath, cough etc.) before then, please call [phone].  If you test positive for Covid 19 in the 2 weeks post procedure, please call and report this information to [place].    If any biopsies were taken you will be contacted by phone or by letter within the next 1-3 weeks.  Please call us at [phone] if you have not heard about the biopsies in 3 weeks.    SIGNATURES/CONFIDENTIALITY: You and/or your care partner have signed paperwork which will be entered into your electronic medical record.  These signatures attest to the fact that that the information above on your After Visit Summary has been reviewed and is understood.  Full responsibility of the confidentiality of this discharge information lies with you and/or your care-partner.

## 2021-02-27 ENCOUNTER — Telehealth: Payer: Self-pay | Admitting: *Deleted

## 2021-02-27 ENCOUNTER — Telehealth: Payer: Self-pay

## 2021-02-27 NOTE — Telephone Encounter (Signed)
Attempted to reach patient for post-procedure f/u call. No answer. Left message that staff will make another attempt to reach her later today and for her to please not hesitate to call us if she has any questions/concerns regarding her care.

## 2021-02-27 NOTE — Telephone Encounter (Signed)
  Follow up Call-  Call back number 02/23/2021  Post procedure Call Back phone  # [phone]  Permission to leave phone message Yes  Some recent data might be hidden   LMOM to call back with any questions or concerns.  Also, call back if patient has developed fever, respiratory issues or been dx with COVID or had any family members or close contacts diagnosed since her procedure.

## 2022-01-28 NOTE — Telephone Encounter (Signed)
Left message
